# Patient Record
Sex: Female | Born: 1955 | Hispanic: Yes | Smoking: Never smoker
Health system: Southern US, Community
[De-identification: ages and names within clinical notes are randomized; demographics above are authoritative.]

## PROBLEM LIST (undated history)

## (undated) DIAGNOSIS — G43909 Migraine, unspecified, not intractable, without status migrainosus: Secondary | ICD-10-CM

## (undated) DIAGNOSIS — E559 Vitamin D deficiency, unspecified: Secondary | ICD-10-CM

## (undated) DIAGNOSIS — M1990 Unspecified osteoarthritis, unspecified site: Secondary | ICD-10-CM

## (undated) DIAGNOSIS — I10 Essential (primary) hypertension: Secondary | ICD-10-CM

## (undated) HISTORY — PX: CARPAL TUNNEL RELEASE: SHX101

## (undated) HISTORY — PX: ROTATOR CUFF REPAIR: SHX139

---

## 2004-05-17 ENCOUNTER — Ambulatory Visit (HOSPITAL_BASED_OUTPATIENT_CLINIC_OR_DEPARTMENT_OTHER): Admission: RE | Admit: 2004-05-17 | Discharge: 2004-05-17 | Payer: Self-pay | Admitting: Orthopedic Surgery

## 2005-02-08 ENCOUNTER — Ambulatory Visit: Payer: Self-pay

## 2005-07-05 ENCOUNTER — Other Ambulatory Visit: Payer: Self-pay

## 2005-07-05 ENCOUNTER — Emergency Department: Payer: Self-pay | Admitting: Emergency Medicine

## 2007-08-02 ENCOUNTER — Emergency Department: Payer: Self-pay | Admitting: Emergency Medicine

## 2008-08-25 ENCOUNTER — Ambulatory Visit: Payer: Self-pay

## 2008-10-02 ENCOUNTER — Emergency Department: Payer: Self-pay | Admitting: Emergency Medicine

## 2009-11-27 ENCOUNTER — Observation Stay: Payer: Self-pay | Admitting: Specialist

## 2009-12-06 ENCOUNTER — Ambulatory Visit: Payer: Self-pay | Admitting: Family Medicine

## 2011-02-02 ENCOUNTER — Ambulatory Visit: Payer: Self-pay | Admitting: Family Medicine

## 2011-05-29 ENCOUNTER — Ambulatory Visit: Payer: Self-pay | Admitting: Family Medicine

## 2012-11-20 ENCOUNTER — Ambulatory Visit: Payer: Self-pay | Admitting: Family Medicine

## 2014-05-06 ENCOUNTER — Ambulatory Visit: Payer: Self-pay

## 2014-05-14 ENCOUNTER — Ambulatory Visit: Payer: Self-pay

## 2015-07-01 ENCOUNTER — Other Ambulatory Visit: Payer: Self-pay | Admitting: Preventative Medicine

## 2015-07-01 DIAGNOSIS — Z1239 Encounter for other screening for malignant neoplasm of breast: Secondary | ICD-10-CM

## 2015-07-15 ENCOUNTER — Ambulatory Visit: Payer: Self-pay

## 2015-07-21 ENCOUNTER — Ambulatory Visit
Admission: RE | Admit: 2015-07-21 | Discharge: 2015-07-21 | Disposition: A | Discharge status: Home / Self Care | Payer: Medicaid Other | Source: Ambulatory Visit | Attending: Preventative Medicine | Admitting: Preventative Medicine

## 2015-07-21 DIAGNOSIS — Z1231 Encounter for screening mammogram for malignant neoplasm of breast: Secondary | ICD-10-CM | POA: Diagnosis present

## 2015-07-21 DIAGNOSIS — Z1239 Encounter for other screening for malignant neoplasm of breast: Secondary | ICD-10-CM

## 2016-01-03 ENCOUNTER — Emergency Department: Payer: Medicaid Other

## 2016-01-03 ENCOUNTER — Emergency Department
Admission: EM | Admit: 2016-01-03 | Discharge: 2016-01-03 | Disposition: A | Discharge status: Home / Self Care | Payer: Medicaid Other | Attending: Emergency Medicine | Admitting: Emergency Medicine

## 2016-01-03 ENCOUNTER — Encounter: Payer: Self-pay | Admitting: *Deleted

## 2016-01-03 DIAGNOSIS — R05 Cough: Secondary | ICD-10-CM | POA: Diagnosis present

## 2016-01-03 DIAGNOSIS — J40 Bronchitis, not specified as acute or chronic: Secondary | ICD-10-CM

## 2016-01-03 DIAGNOSIS — I10 Essential (primary) hypertension: Secondary | ICD-10-CM | POA: Diagnosis not present

## 2016-01-03 DIAGNOSIS — R059 Cough, unspecified: Secondary | ICD-10-CM

## 2016-01-03 HISTORY — DX: Essential (primary) hypertension: I10

## 2016-01-03 LAB — BASIC METABOLIC PANEL
Anion gap: 7 (ref 5–15)
BUN: 12 mg/dL (ref 6–20)
CALCIUM: 8.7 mg/dL — AB (ref 8.9–10.3)
CO2: 25 mmol/L (ref 22–32)
CREATININE: 0.73 mg/dL (ref 0.44–1.00)
Chloride: 103 mmol/L (ref 101–111)
GFR calc non Af Amer: >60 mL/min (ref >60)
GLUCOSE: 112 mg/dL — AB (ref 65–99)
Potassium: 3.6 mmol/L (ref 3.5–5.1)
Sodium: 135 mmol/L (ref 135–145)

## 2016-01-03 LAB — CBC
HCT: 42.1 % (ref 35.0–47.0)
Hemoglobin: 14.2 g/dL (ref 12.0–16.0)
MCH: 29.4 pg (ref 26.0–34.0)
MCHC: 33.8 g/dL (ref 32.0–36.0)
MCV: 87 fL (ref 80.0–100.0)
PLATELETS: 268 10*3/uL (ref 150–440)
RBC: 4.84 MIL/uL (ref 3.80–5.20)
RDW: 14.2 % (ref 11.5–14.5)
WBC: 9.7 10*3/uL (ref 3.6–11.0)

## 2016-01-03 LAB — TROPONIN I

## 2016-01-03 MED ORDER — AZITHROMYCIN 250 MG PO TABS: ORAL_TABLET | ORAL | 0 refills | Status: AC

## 2016-01-03 MED ORDER — DIAZEPAM 5 MG/ML IJ SOLN: 2.0000 mg | Freq: Once | INTRAMUSCULAR | Status: DC

## 2016-01-03 MED ORDER — AZITHROMYCIN 500 MG PO TABS
500.0000 mg | ORAL_TABLET | Freq: Once | ORAL | Status: AC
  Administered 2016-01-03: 500 mg via ORAL
  Filled 2016-01-03: qty 1

## 2016-01-03 MED ORDER — PREDNISONE 20 MG PO TABS: 60.0000 mg | ORAL_TABLET | Freq: Every day | ORAL | 0 refills | Status: AC

## 2016-01-03 MED ORDER — IPRATROPIUM-ALBUTEROL 0.5-2.5 (3) MG/3ML IN SOLN
9.0000 mL | Freq: Once | RESPIRATORY_TRACT | Status: AC
  Administered 2016-01-03: 9 mL via RESPIRATORY_TRACT
  Filled 2016-01-03: qty 9

## 2016-01-03 MED ORDER — PREDNISONE 20 MG PO TABS
60.0000 mg | ORAL_TABLET | Freq: Once | ORAL | Status: AC
  Administered 2016-01-03: 60 mg via ORAL
  Filled 2016-01-03: qty 3

## 2016-01-03 MED ORDER — ALBUTEROL SULFATE HFA 108 (90 BASE) MCG/ACT IN AERS: 2.0000 | INHALATION_SPRAY | Freq: Four times a day (QID) | RESPIRATORY_TRACT | 0 refills | Status: AC | PRN

## 2016-01-03 NOTE — ED Notes (Signed)
Pt. Reporting minimal relief following completion of neb tx. Attempting productive cough, but soreness in chest prevents.

## 2016-01-03 NOTE — ED Notes (Signed)
Pt. Verbalizes understanding of d/c instructions, prescriptions, and follow-up per Interpreter. VS stable and pain controlled per pt.  Pt. In NAD at time of d/c and denies further concerns regarding this visit. Pt. Stable at the time of departure from the unit, departing unit by the safest and most appropriate manner per that pt condition and limitations. Pt advised to return to the ED at any time for emergent concerns, or for new/worsening symptoms.

## 2016-01-03 NOTE — ED Triage Notes (Addendum)
States chest discomfort since Tuesday, states she saw her PCP on Thursday and was given protonix for indigestion, pt states continued discomfort, states fatigue with walking and increased pain

## 2016-01-03 NOTE — ED Notes (Signed)
Pt. Reports discomfort with deep breathing in the chest.

## 2016-01-03 NOTE — ED Provider Notes (Signed)
[facility] Emergency Department Provider Note   ____________________________________________   First MD Initiated Contact with Patient 01/03/16 1826     (approximate)  I have reviewed the triage vital signs and the nursing notes.  Patient is Spanish-speaking only. Interpreter, Duwayne Hecksaiah, present for the interaction. HISTORY  Chief Complaint Chest Pain   HPI Dorothy Patton is a 60 y.o. female with a history of hypertension who is presenting to the emergency department today with 1 week of worsening chest pain and shortness of breath. She says that her chest feels tight and that she has difficulty breathing. She says especially at night she feels like she is having snoring respirations. Over the past 2 days she has began to have a worsening cough. She says that this past Thursday she saw her primary care doctor who thought that she had inflammation or lungs and also gave her a medicine for reflux. However, her condition has continued to worsen. She denies any runny nose but says that she has been coughing up intermittent green mucus. Denies any known sick contacts. Does not report any fevers. Denies any overt pain of the chest describes more as tightness.   Past Medical History:  Diagnosis Date  . Hypertension     There are no active problems to display for this patient.   History reviewed. No pertinent surgical history.  Prior to Admission medications   Not on File    Allergies Review of patient's allergies indicates no known allergies.  History reviewed. No pertinent family history.  Social History Social History  Substance Use Topics  . Smoking status: Never Smoker  . Smokeless tobacco: Never Used  . Alcohol use Not on file    Review of Systems Constitutional: No fever/chills Eyes: No visual changes. ENT: No sore throat. Cardiovascular: As above Respiratory: As above Gastrointestinal: No abdominal pain.  No nausea, no vomiting.  No  diarrhea.  No constipation. Genitourinary: Negative for dysuria. Musculoskeletal: Negative for back pain. Skin: Negative for rash. Neurological: Negative for headaches, focal weakness or numbness.  10-point ROS otherwise negative.  ____________________________________________   PHYSICAL EXAM:  VITAL SIGNS: ED Triage Vitals  Enc Vitals Group     BP 01/03/16 1714 (!) 152/82     Pulse Rate 01/03/16 1714 96     Resp 01/03/16 1714 18     Temp 01/03/16 1714 98 F (36.7 C)     Temp Source 01/03/16 1714 Oral     SpO2 01/03/16 1714 100 %     Weight 01/03/16 1714 242 lb (109.8 kg)     Height 01/03/16 1714 5\' 2"  (1.575 m)     Head Circumference --      Peak Flow --      Pain Score 01/03/16 1715 8     Pain Loc --      Pain Edu? --      Excl. in GC? --     Constitutional: Alert and oriented. Well appearing and in no acute distress. Eyes: Conjunctivae are normal. PERRL. EOMI. Head: Atraumatic. Nose: Mild rhinorrhea bilaterally Mouth/Throat: Mucous membranes are moist.  Oropharynx non-erythematous. Neck: No stridor.   Cardiovascular: Normal rate, regular rhythm. Grossly normal heart sounds.  Tenderness over the sternum to palpation. Respiratory: Normal respiratory effort.  No retractions. Mild and scant wheezing scattered throughout all fields. No respiratory distress. Speaks in full sentences. Gastrointestinal: Soft and nontender. No distention.  Musculoskeletal: No lower extremity tenderness nor edema.  No joint effusions. Neurologic:  Normal speech and  language. No gross focal neurologic deficits are appreciated. Skin:  Skin is warm, dry and intact. No rash noted. Psychiatric: Mood and affect are normal. Speech and behavior are normal.  ____________________________________________   LABS (all labs ordered are listed, but only abnormal results are displayed)  Labs Reviewed  BASIC METABOLIC PANEL - Abnormal; Notable for the following:       Result Value   Glucose, Bld 112 (*)     Calcium 8.7 (*)    All other components within normal limits  CBC  TROPONIN I   ____________________________________________  EKG  ED ECG REPORT I, Arelia Longest, the attending physician, personally viewed and interpreted this ECG.   Date: 01/03/2016  EKG Time: 1715  Rate: 92  Rhythm: normal sinus rhythm  Axis: Normal  Intervals:none  ST&T Change: No ST segment elevation or depression. No abnormal T-wave inversion.  ____________________________________________  RADIOLOGY  DG Chest 2 View (Accession [id number]) (Order [id number])  Imaging  Date: 01/03/2016 Department: [facility] EMERGENCY DEPARTMENT Released By: Sharyn Blitz, RN (auto-released) Authorizing: Myrna Blazer, MD  PACS Images   Show images for DG Chest 2 View  Study Result   CLINICAL DATA:  60 year old female with chest  EXAM: CHEST  2 VIEW  COMPARISON:  Chest radiograph dated 11/27/2009  FINDINGS: The heart size and mediastinal contours are within normal limits. Both lungs are clear. The visualized skeletal structures are unremarkable.  IMPRESSION: No active cardiopulmonary disease.   Electronically Signed   By: Elgie Collard M.D.   On: 01/03/2016 18:22     ____________________________________________   PROCEDURES  Procedure(s) performed:   Procedures  Critical Care performed:   ____________________________________________   INITIAL IMPRESSION / ASSESSMENT AND PLAN / ED COURSE  Pertinent labs & imaging results that were available during my care of the patient were reviewed by me and considered in my medical decision making (see chart for details).  Patient's presentation is consistent with bronchitis. We'll treat with DuoNeb as well as steroids in the emergency department and reassess.  Clinical Course   ----------------------------------------- 8:31 PM on 01/03/2016 -----------------------------------------  Patient with  very reassuring imaging as well as lab work thus far. However, still with pain. We'll give Toradol as well as Valium and reassess. Patient at this time, despite still complaining of pain does not appear in any acute distress. Vital signs remain normal.   ----------------------------------------- 8:43 PM on 01/03/2016 -----------------------------------------  Patient says that she does not feel any chest pressure and is breathing easier after name Theora Gianotti treatments and steroids. However, she feels still the irritation in her throat. She is having intermittent coughing still. However, I auscultate her throat and she is not having any stridor. There is no rest or distress. She likely has a bronchitis and possibly postnasal drip. I'll be discharging her on azithromycin as well as prednisone with an inhaler. She'll be following up with her primary care doctor. We discussed return precautions including any worsening and concerning symptoms especially if she has difficulty breathing or feels her throat closing. She is understanding of this plan and willing to comply. The interpreter, Duwayne Heck, was there as well for this follow-up examination. ____________________________________________   FINAL CLINICAL IMPRESSION(S) / ED DIAGNOSES  Bronchitis. Cough.    NEW MEDICATIONS STARTED DURING THIS VISIT:  New Prescriptions   No medications on file     Note:  This document was prepared using Dragon voice recognition software and may include unintentional dictation errors.    Onalee Hua  Ardath Sax, MD 01/03/16 2045

## 2016-01-03 NOTE — ED Notes (Signed)
Pt. Displaying equal, even, unlabored breaths with reduced coughing from previous round.

## 2016-01-03 NOTE — ED Notes (Signed)
Pt. Up to restroom at this time.

## 2016-03-16 ENCOUNTER — Emergency Department: Payer: Medicaid Other

## 2016-03-16 ENCOUNTER — Encounter: Payer: Self-pay | Admitting: Emergency Medicine

## 2016-03-16 ENCOUNTER — Emergency Department
Admission: EM | Admit: 2016-03-16 | Discharge: 2016-03-16 | Disposition: A | Discharge status: Home / Self Care | Payer: Medicaid Other | Attending: Emergency Medicine | Admitting: Emergency Medicine

## 2016-03-16 DIAGNOSIS — R42 Dizziness and giddiness: Secondary | ICD-10-CM

## 2016-03-16 DIAGNOSIS — I10 Essential (primary) hypertension: Secondary | ICD-10-CM | POA: Diagnosis not present

## 2016-03-16 HISTORY — DX: Vitamin D deficiency, unspecified: E55.9

## 2016-03-16 HISTORY — DX: Unspecified osteoarthritis, unspecified site: M19.90

## 2016-03-16 HISTORY — DX: Migraine, unspecified, not intractable, without status migrainosus: G43.909

## 2016-03-16 LAB — TROPONIN I
Troponin I: 0.03 ng/mL (ref <0.03)
Troponin I: <0.03 ng/mL (ref <0.03)

## 2016-03-16 LAB — COMPREHENSIVE METABOLIC PANEL
ALBUMIN: 3.9 g/dL (ref 3.5–5.0)
ALT: 18 U/L (ref 14–54)
ANION GAP: 9 (ref 5–15)
AST: 18 U/L (ref 15–41)
Alkaline Phosphatase: 58 U/L (ref 38–126)
BILIRUBIN TOTAL: 0.8 mg/dL (ref 0.3–1.2)
BUN: 23 mg/dL — AB (ref 6–20)
CO2: 20 mmol/L — AB (ref 22–32)
Calcium: 9.5 mg/dL (ref 8.9–10.3)
Chloride: 105 mmol/L (ref 101–111)
Creatinine, Ser: 0.61 mg/dL (ref 0.44–1.00)
GFR calc Af Amer: >60 mL/min (ref >60)
GFR calc non Af Amer: >60 mL/min (ref >60)
GLUCOSE: 154 mg/dL — AB (ref 65–99)
POTASSIUM: 4 mmol/L (ref 3.5–5.1)
SODIUM: 134 mmol/L — AB (ref 135–145)
Total Protein: 7.9 g/dL (ref 6.5–8.1)

## 2016-03-16 LAB — CBC
HEMATOCRIT: 45.3 % (ref 35.0–47.0)
HEMOGLOBIN: 14.2 g/dL (ref 12.0–16.0)
MCH: 27.5 pg (ref 26.0–34.0)
MCHC: 31.4 g/dL — AB (ref 32.0–36.0)
MCV: 87.4 fL (ref 80.0–100.0)
Platelets: 331 10*3/uL (ref 150–440)
RBC: 5.18 MIL/uL (ref 3.80–5.20)
RDW: 14.7 % — AB (ref 11.5–14.5)
WBC: 13.3 10*3/uL — AB (ref 3.6–11.0)

## 2016-03-16 MED ORDER — MECLIZINE HCL 25 MG PO TABS: 25.0000 mg | ORAL_TABLET | Freq: Three times a day (TID) | ORAL | 0 refills | Status: DC | PRN

## 2016-03-16 MED ORDER — MECLIZINE HCL 25 MG PO TABS
25.0000 mg | ORAL_TABLET | Freq: Once | ORAL | Status: AC
  Administered 2016-03-16: 25 mg via ORAL
  Filled 2016-03-16: qty 1

## 2016-03-16 MED ORDER — LORAZEPAM 2 MG/ML IJ SOLN
0.5000 mg | Freq: Once | INTRAMUSCULAR | Status: AC
  Administered 2016-03-16: 0.5 mg via INTRAVENOUS
  Filled 2016-03-16: qty 1

## 2016-03-16 MED ORDER — SODIUM CHLORIDE 0.9 % IV SOLN: 1000.0000 mL | Freq: Once | INTRAVENOUS | Status: DC

## 2016-03-16 MED ORDER — SODIUM CHLORIDE 0.9 % IV SOLN
1000.0000 mL | Freq: Once | INTRAVENOUS | Status: AC
  Administered 2016-03-16: 1000 mL via INTRAVENOUS

## 2016-03-16 NOTE — ED Notes (Signed)
Pt to MRI via stretcher with tech

## 2016-03-16 NOTE — ED Notes (Signed)
Dr. Cyril Loosenkinner notified of trop. 0.03

## 2016-03-16 NOTE — Discharge Instructions (Signed)
Stay hydrated.   Take meclizine as needed for dizziness.   See your doctor  Return to ER if you have worse dizziness, vertigo, passing out, chest pain.

## 2016-03-16 NOTE — ED Triage Notes (Signed)
Per interpreter pt with dizziness that started this am around 0620. Pt denies any pain at this time. Pt states she suffers from migraines and has hot flashes.

## 2016-03-16 NOTE — ED Notes (Signed)
All assessment done with interpreter at bedside

## 2016-03-16 NOTE — ED Provider Notes (Signed)
  Physical Exam  BP 129/77   Pulse 89   Temp 98.9 F (37.2 C) (Oral)   Resp 15   Ht 5\' 2"  (1.575 m)   Wt 242 lb (109.8 kg)   SpO2 100%   BMI 44.26 kg/m   Physical Exam  ED Course  Procedures  MDM Care assumed at 4 pm from Dr. Cyril LoosenKinner. Patient here with dizziness. Initial Trop 0.03 but repeat neg. Bicarb 20. Appears dehydrated. Given meclizine, ativan, IVF but still dizzy. On initial exam, has trouble opening eyes. Seems to have nystagmus changing with directions. No other stroke symptoms.  Patient not orthostatic. Sign out pending NS 1 L bolus and reassessment. After 1 L NS bolus, felt better. Has some nystagmus but able to walk. MRI showed no stroke. Will dc home with meclizine for likely peripheral vertigo.     Charlynne Panderavid Hsienta Yao, MD 03/16/16 [phone]

## 2016-03-16 NOTE — ED Notes (Signed)
Patient transported to MRI 

## 2016-03-25 NOTE — ED Provider Notes (Signed)
[facility] Emergency Department Provider Note   ____________________________________________    I have reviewed the triage vital signs and the nursing notes.   HISTORY  Chief Complaint Dizziness     HPI Dorothy Patton is a 60 y.o. female who presents with complaints of dizziness. Patient reports she has a sensation of the room spinning but also feels lightheaded at times. She denies fevers or chills. She denies neuro deficits. She denies headache. Mild nausea but no vomiting. She has never had this before.   Past Medical History:  Diagnosis Date  . Arthritis   . Hypertension   . Migraines   . Vitamin D deficiency     There are no active problems to display for this patient.   Past Surgical History:  Procedure Laterality Date  . CARPAL TUNNEL RELEASE    . ROTATOR CUFF REPAIR      Prior to Admission medications   Medication Sig Start Date End Date Taking? Authorizing Provider  albuterol (PROVENTIL HFA;VENTOLIN HFA) 108 (90 Base) MCG/ACT inhaler Inhale 2 puffs into the lungs every 6 (six) hours as needed for wheezing or shortness of breath. 01/03/16   Myrna Blazeravid Matthew Schaevitz, MD  meclizine (ANTIVERT) 25 MG tablet Take 1 tablet (25 mg total) by mouth 3 (three) times daily as needed for dizziness. 03/16/16   Charlynne Panderavid Hsienta Yao, MD  predniSONE (DELTASONE) 20 MG tablet Take 3 tablets (60 mg total) by mouth daily. 01/04/16 01/03/17  Myrna Blazeravid Matthew Schaevitz, MD     Allergies Patient has no known allergies.  No family history on file.  Social History Social History  Substance Use Topics  . Smoking status: Never Smoker  . Smokeless tobacco: Never Used  . Alcohol use No    Review of Systems  Constitutional: No fever/chills Eyes: No visual changes.   Cardiovascular: Denies chest pain. Respiratory: Denies shortness of breath. Gastrointestinal: No abdominal pain.  Genitourinary: Negative for dysuria. Musculoskeletal: Negative for back  pain. Skin: Negative for rash. Neurological: Negative for Focal weakness  10-point ROS otherwise negative.  ____________________________________________   PHYSICAL EXAM:  VITAL SIGNS: ED Triage Vitals   Enc Vitals Group     BP (!) 155/94     Pulse Rate (!) 109     Resp 20     Temp 98.9 F (37.2 C)     Temp Source Oral     SpO2 97 %     Weight 242 lb (109.8 kg)     Height 5\' 2"  (1.575 m)     Head Circumference      Peak Flow      Pain Score      Pain Loc      Pain Edu?      Excl. in GC?     Constitutional: Alert and oriented. No acute distress. Pleasant and interactive Eyes: Conjunctivae are normal. nystagmus, horizontal, tympanic membranes normal bilaterally Head: Atraumatic. Nose: No congestion/rhinnorhea. Mouth/Throat: Mucous membranes are moist.   Neck:  Painless ROM Cardiovascular: Normal rate, regular rhythm. Grossly normal heart sounds.  Good peripheral circulation. Respiratory: Normal respiratory effort.  No retractions. Lungs CTAB. Gastrointestinal: Soft and nontender. No distention.  No CVA tenderness. Genitourinary: deferred Musculoskeletal: No lower extremity tenderness nor edema.  Warm and well perfused Neurologic:  Normal speech and language. No gross focal neurologic deficits are appreciated. Cranial nerves II-12 are normal Skin:  Skin is warm, dry and intact. No rash noted. Psychiatric: Mood and affect are normal. Speech and behavior are  normal.  ____________________________________________   LABS (all labs ordered are listed, but only abnormal results are displayed)  Labs Reviewed  CBC - Abnormal; Notable for the following:       Result Value   WBC 13.3 (*)    MCHC 31.4 (*)    RDW 14.7 (*)    All other components within normal limits  COMPREHENSIVE METABOLIC PANEL - Abnormal; Notable for the following:    Sodium 134 (*)    CO2 20 (*)    Glucose, Bld 154 (*)    BUN 23 (*)    All other components within normal limits  TROPONIN I -  Abnormal; Notable for the following:    Troponin I 0.03 (*)    All other components within normal limits  TROPONIN I   ____________________________________________  EKG  ED ECG REPORT I, Jene EveryKINNER, Sandralee Tarkington, the attending physician, personally viewed and interpreted this ECG.   Rate: 104 Rhythm: Sinus tachycardia QRS Axis: normal Intervals: normal ST/T Wave abnormalities: Nonspecific Conduction Disturbances: none Narrative Interpretation: unremarkable  ____________________________________________  RADIOLOGY  CT head unremarkable ____________________________________________   PROCEDURES  Procedure(s) performed: No    Critical Care performed: No ____________________________________________   INITIAL IMPRESSION / ASSESSMENT AND PLAN / ED COURSE  Pertinent labs & imaging results that were available during my care of the patient were reviewed by me and considered in my medical decision making (see chart for details).  Patient presents with dizziness which seems to be most consistent with vertigo, we will treat with meclizine and Ativan and IV fluids and reevaluate.  Clinical Course   Asked Dr. Silverio Layyao to follow up on patient after repeat fluids ____________________________________________   FINAL CLINICAL IMPRESSION(S) / ED DIAGNOSES  Final diagnoses:  Vertigo      NEW MEDICATIONS STARTED DURING THIS VISIT:  Discharge Medication List as of 03/16/2016  5:51 PM    START taking these medications   Details  meclizine (ANTIVERT) 25 MG tablet Take 1 tablet (25 mg total) by mouth 3 (three) times daily as needed for dizziness., Starting Thu 03/16/2016, Print         Note:  This document was prepared using Dragon voice recognition software and may include unintentional dictation errors.    Jene Everyobert Claretta Kendra, MD 03/25/16 [phone]

## 2016-11-09 ENCOUNTER — Other Ambulatory Visit: Payer: Self-pay | Admitting: Family Medicine

## 2016-11-09 DIAGNOSIS — Z1231 Encounter for screening mammogram for malignant neoplasm of breast: Secondary | ICD-10-CM

## 2016-12-18 ENCOUNTER — Ambulatory Visit
Admission: RE | Admit: 2016-12-18 | Discharge: 2016-12-18 | Disposition: A | Discharge status: Home / Self Care | Payer: Medicare Other | Source: Ambulatory Visit | Attending: Family Medicine | Admitting: Family Medicine

## 2016-12-18 DIAGNOSIS — Z1231 Encounter for screening mammogram for malignant neoplasm of breast: Secondary | ICD-10-CM | POA: Diagnosis not present

## 2017-11-16 IMAGING — MG MM DIGITAL SCREENING BILAT W/ CAD
1 series · 5 of 5 positions shown · non-contrast
Comparison: Previous exam(s).

CLINICAL DATA: Screening.

EXAM:
DIGITAL SCREENING BILATERAL MAMMOGRAM WITH CAD

[R CC · right · 5 of 5 slices shown]
[im 1/5]
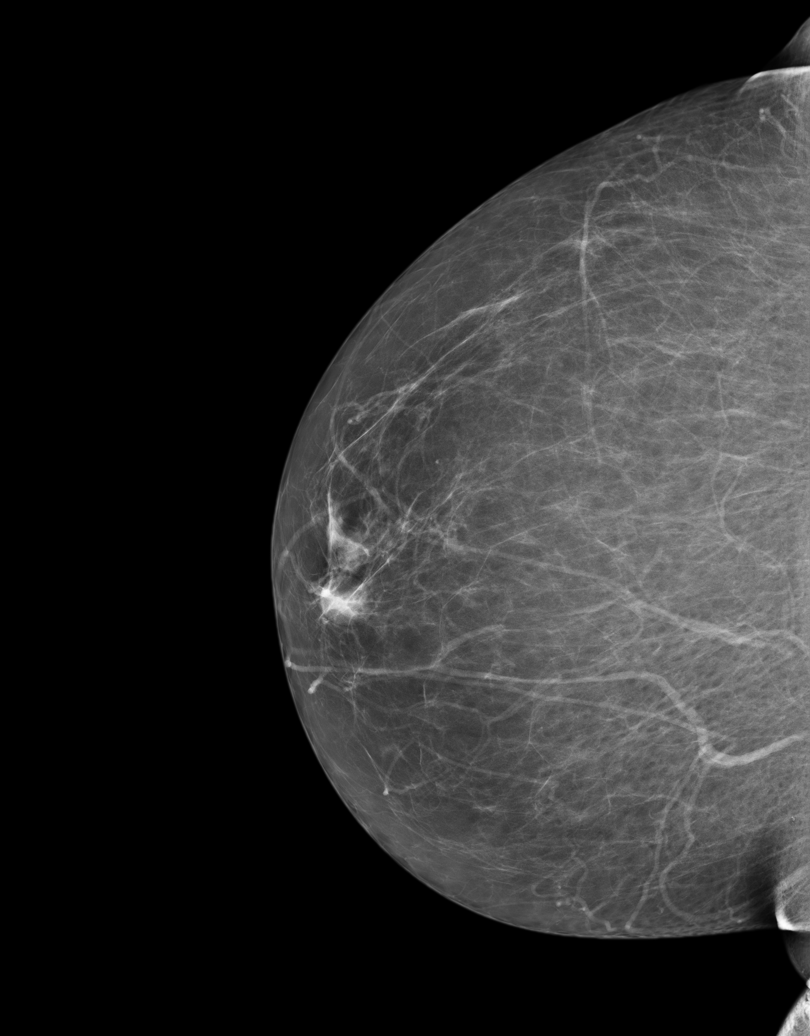
[im 2/5]
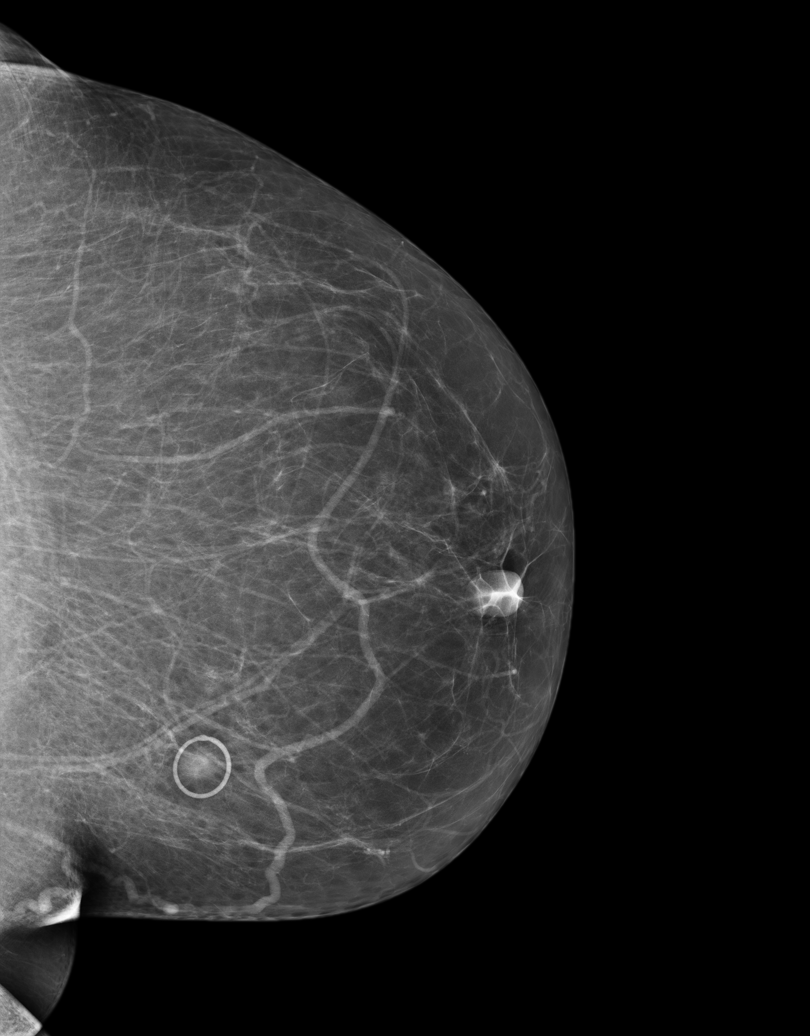
[im 3/5]
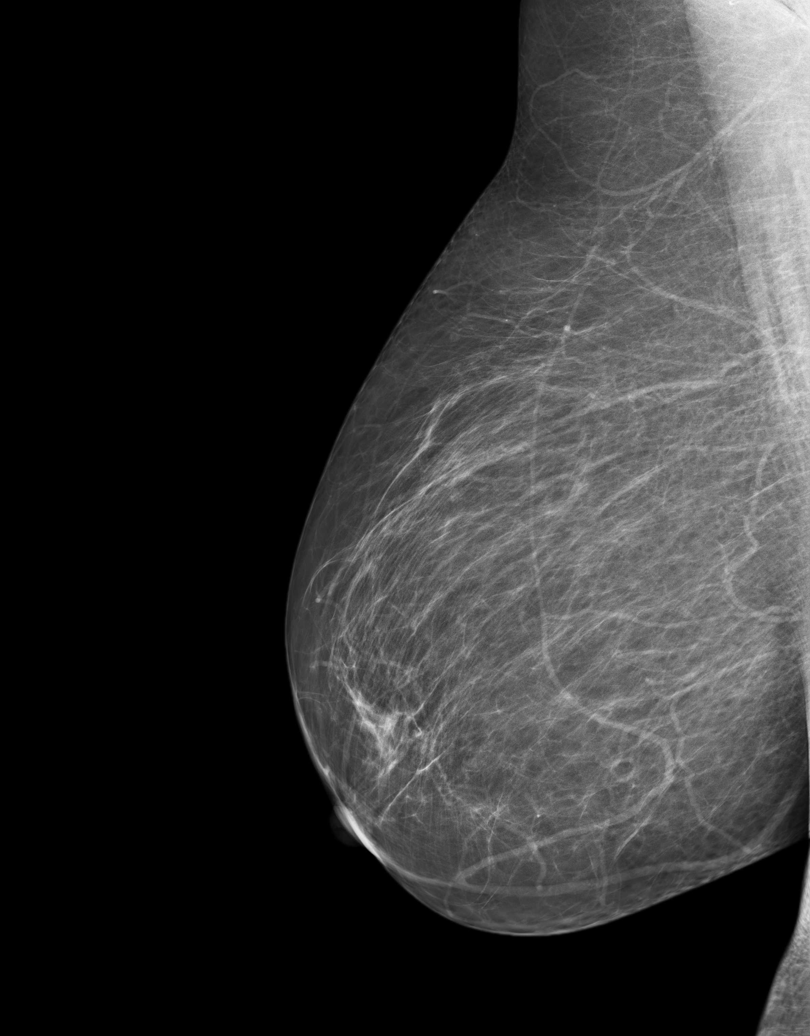
[im 4/5]
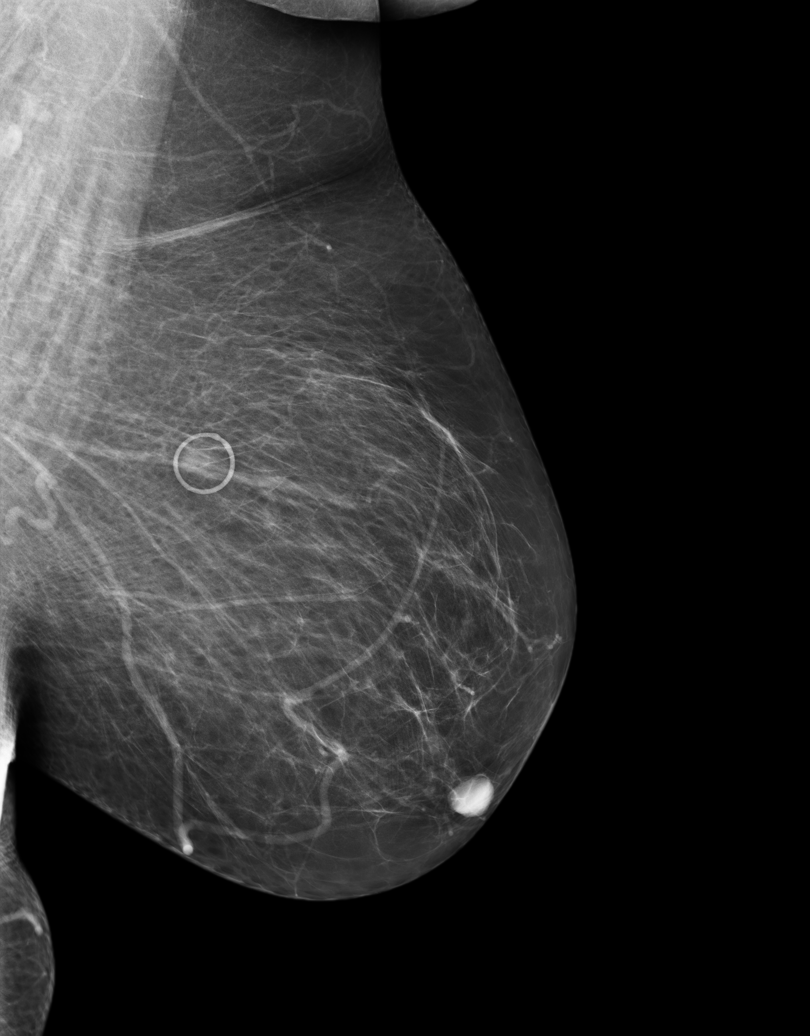
[im 5/5]
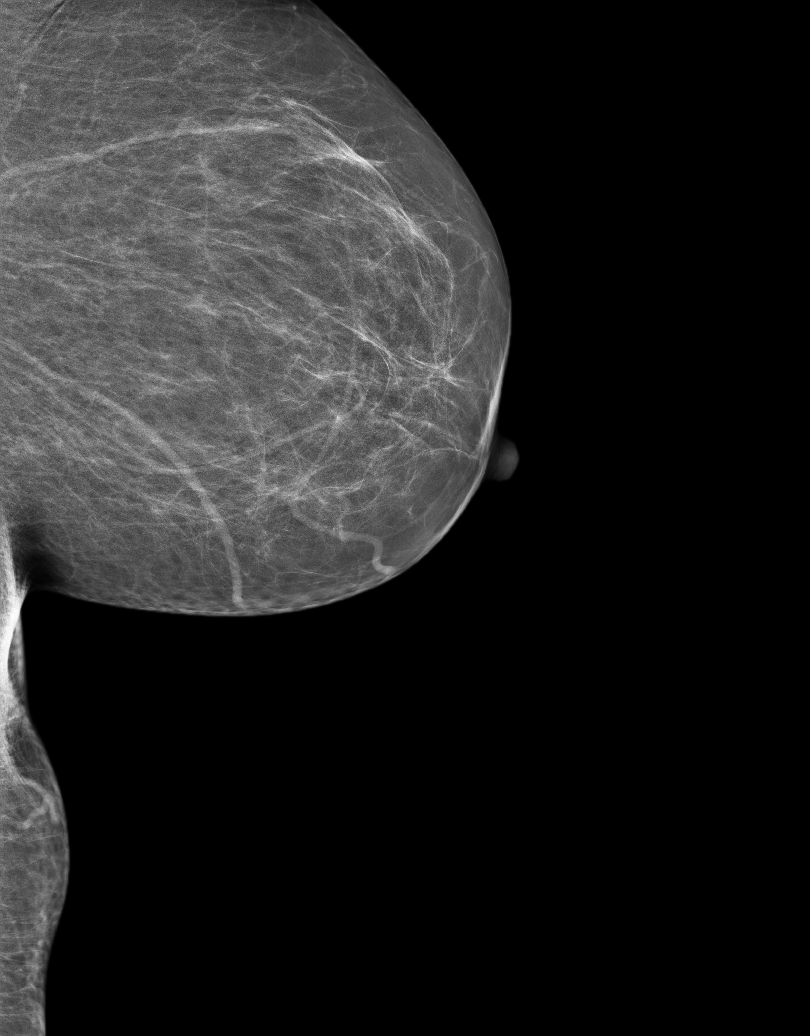

[5 of 5 positions shown; findings below may reference images not displayed]

ACR Breast Density Category b: There are scattered areas of
fibroglandular density.
FINDINGS: There are no findings suspicious for malignancy. Images were
processed with CAD.
IMPRESSION: No mammographic evidence of malignancy. A result letter of this
screening mammogram will be mailed directly to the patient.

RECOMMENDATION:
Screening mammogram in one year. (Code:AS-G-LCT)

BI-RADS CATEGORY  1: Negative.

## 2017-11-29 ENCOUNTER — Other Ambulatory Visit: Payer: Self-pay | Admitting: Family Medicine

## 2017-11-29 DIAGNOSIS — Z1231 Encounter for screening mammogram for malignant neoplasm of breast: Secondary | ICD-10-CM

## 2017-12-19 ENCOUNTER — Ambulatory Visit
Admission: RE | Admit: 2017-12-19 | Discharge: 2017-12-19 | Disposition: A | Discharge status: Home / Self Care | Payer: Medicare Other | Source: Ambulatory Visit | Attending: Family Medicine | Admitting: Family Medicine

## 2017-12-19 DIAGNOSIS — Z1231 Encounter for screening mammogram for malignant neoplasm of breast: Secondary | ICD-10-CM | POA: Diagnosis not present

## 2020-01-03 ENCOUNTER — Emergency Department: Payer: Medicare Other

## 2020-01-03 ENCOUNTER — Encounter: Payer: Self-pay | Admitting: Radiology

## 2020-01-03 ENCOUNTER — Other Ambulatory Visit: Payer: Self-pay

## 2020-01-03 DIAGNOSIS — I10 Essential (primary) hypertension: Secondary | ICD-10-CM | POA: Diagnosis present

## 2020-01-03 DIAGNOSIS — E871 Hypo-osmolality and hyponatremia: Secondary | ICD-10-CM | POA: Diagnosis present

## 2020-01-03 DIAGNOSIS — U071 COVID-19: Secondary | ICD-10-CM | POA: Diagnosis not present

## 2020-01-03 DIAGNOSIS — Z6841 Body Mass Index (BMI) 40.0 and over, adult: Secondary | ICD-10-CM

## 2020-01-03 DIAGNOSIS — E876 Hypokalemia: Secondary | ICD-10-CM | POA: Diagnosis present

## 2020-01-03 DIAGNOSIS — G43909 Migraine, unspecified, not intractable, without status migrainosus: Secondary | ICD-10-CM | POA: Diagnosis present

## 2020-01-03 DIAGNOSIS — R0602 Shortness of breath: Secondary | ICD-10-CM | POA: Diagnosis not present

## 2020-01-03 DIAGNOSIS — J1282 Pneumonia due to coronavirus disease 2019: Secondary | ICD-10-CM | POA: Diagnosis present

## 2020-01-03 DIAGNOSIS — R0902 Hypoxemia: Secondary | ICD-10-CM | POA: Diagnosis present

## 2020-01-03 DIAGNOSIS — M1990 Unspecified osteoarthritis, unspecified site: Secondary | ICD-10-CM | POA: Diagnosis present

## 2020-01-03 LAB — CBC WITH DIFFERENTIAL/PLATELET
Abs Immature Granulocytes: 0.05 10*3/uL (ref 0.00–0.07)
Basophils Absolute: 0 10*3/uL (ref 0.0–0.1)
Basophils Relative: 0 %
Eosinophils Absolute: 0 10*3/uL (ref 0.0–0.5)
Eosinophils Relative: 0 %
HCT: 37 % (ref 36.0–46.0)
Hemoglobin: 12.3 g/dL (ref 12.0–15.0)
Immature Granulocytes: 1 %
Lymphocytes Relative: 6 %
Lymphs Abs: 0.5 10*3/uL — ABNORMAL LOW (ref 0.7–4.0)
MCH: 26.5 pg (ref 26.0–34.0)
MCHC: 33.2 g/dL (ref 30.0–36.0)
MCV: 79.7 fL — ABNORMAL LOW (ref 80.0–100.0)
Monocytes Absolute: 0.4 10*3/uL (ref 0.1–1.0)
Monocytes Relative: 5 %
Neutro Abs: 7.8 10*3/uL — ABNORMAL HIGH (ref 1.7–7.7)
Neutrophils Relative %: 88 %
Platelets: 234 10*3/uL (ref 150–400)
RBC: 4.64 MIL/uL (ref 3.87–5.11)
RDW: 15.4 % (ref 11.5–15.5)
WBC: 8.7 10*3/uL (ref 4.0–10.5)
nRBC: 0 % (ref 0.0–0.2)

## 2020-01-03 LAB — COMPREHENSIVE METABOLIC PANEL
ALT: 23 U/L (ref 0–44)
AST: 35 U/L (ref 15–41)
Albumin: 3.1 g/dL — ABNORMAL LOW (ref 3.5–5.0)
Alkaline Phosphatase: 63 U/L (ref 38–126)
Anion gap: 12 (ref 5–15)
BUN: 10 mg/dL (ref 8–23)
CO2: 22 mmol/L (ref 22–32)
Calcium: 8.5 mg/dL — ABNORMAL LOW (ref 8.9–10.3)
Chloride: 93 mmol/L — ABNORMAL LOW (ref 98–111)
Creatinine, Ser: 0.63 mg/dL (ref 0.44–1.00)
GFR calc Af Amer: >60 mL/min (ref >60)
GFR calc non Af Amer: >60 mL/min (ref >60)
Glucose, Bld: 139 mg/dL — ABNORMAL HIGH (ref 70–99)
Potassium: 3.4 mmol/L — ABNORMAL LOW (ref 3.5–5.1)
Sodium: 127 mmol/L — ABNORMAL LOW (ref 135–145)
Total Bilirubin: 0.6 mg/dL (ref 0.3–1.2)
Total Protein: 7.5 g/dL (ref 6.5–8.1)

## 2020-01-03 LAB — TROPONIN I (HIGH SENSITIVITY): Troponin I (High Sensitivity): 5 ng/L (ref <18)

## 2020-01-03 NOTE — ED Triage Notes (Addendum)
Info obtained via [facility] interpreter [place].  Pateint reports trouble breathing since yesterday.  Received +COVID test on Wednesday.

## 2020-01-04 ENCOUNTER — Inpatient Hospital Stay
Admission: EM | Admit: 2020-01-04 | Discharge: 2020-01-08 | DRG: 177 | Disposition: A | Discharge status: Home / Self Care | Payer: Medicare Other | Attending: Internal Medicine | Admitting: Internal Medicine

## 2020-01-04 DIAGNOSIS — R0902 Hypoxemia: Secondary | ICD-10-CM | POA: Diagnosis present

## 2020-01-04 DIAGNOSIS — G43909 Migraine, unspecified, not intractable, without status migrainosus: Secondary | ICD-10-CM | POA: Diagnosis present

## 2020-01-04 DIAGNOSIS — J1282 Pneumonia due to coronavirus disease 2019: Secondary | ICD-10-CM

## 2020-01-04 DIAGNOSIS — E871 Hypo-osmolality and hyponatremia: Secondary | ICD-10-CM | POA: Diagnosis present

## 2020-01-04 DIAGNOSIS — Z6841 Body Mass Index (BMI) 40.0 and over, adult: Secondary | ICD-10-CM | POA: Diagnosis not present

## 2020-01-04 DIAGNOSIS — R0602 Shortness of breath: Secondary | ICD-10-CM | POA: Diagnosis present

## 2020-01-04 DIAGNOSIS — I10 Essential (primary) hypertension: Secondary | ICD-10-CM | POA: Diagnosis present

## 2020-01-04 DIAGNOSIS — U071 COVID-19: Secondary | ICD-10-CM | POA: Diagnosis present

## 2020-01-04 DIAGNOSIS — M1990 Unspecified osteoarthritis, unspecified site: Secondary | ICD-10-CM | POA: Diagnosis present

## 2020-01-04 DIAGNOSIS — E876 Hypokalemia: Secondary | ICD-10-CM

## 2020-01-04 LAB — HIV ANTIBODY (ROUTINE TESTING W REFLEX): HIV Screen 4th Generation wRfx: NONREACTIVE

## 2020-01-04 LAB — FIBRIN DERIVATIVES D-DIMER (ARMC ONLY): Fibrin derivatives D-dimer (ARMC): 911.89 ng/mL (FEU) — ABNORMAL HIGH (ref 0.00–499.00)

## 2020-01-04 LAB — TROPONIN I (HIGH SENSITIVITY): Troponin I (High Sensitivity): 10 ng/L (ref <18)

## 2020-01-04 LAB — FERRITIN: Ferritin: 159 ng/mL (ref 11–307)

## 2020-01-04 LAB — C-REACTIVE PROTEIN: CRP: 16.3 mg/dL — ABNORMAL HIGH (ref <1.0)

## 2020-01-04 MED ORDER — ONDANSETRON HCL 4 MG/2ML IJ SOLN: 4.0000 mg | Freq: Four times a day (QID) | INTRAMUSCULAR | Status: DC | PRN

## 2020-01-04 MED ORDER — SODIUM CHLORIDE 0.9 % IV SOLN
200.0000 mg | Freq: Once | INTRAVENOUS | Status: AC
  Administered 2020-01-04: 200 mg via INTRAVENOUS
  Filled 2020-01-04: qty 200

## 2020-01-04 MED ORDER — DEXAMETHASONE SODIUM PHOSPHATE 10 MG/ML IJ SOLN
10.0000 mg | Freq: Once | INTRAMUSCULAR | Status: DC
  Filled 2020-01-04: qty 1

## 2020-01-04 MED ORDER — POTASSIUM CHLORIDE CRYS ER 20 MEQ PO TBCR
40.0000 meq | EXTENDED_RELEASE_TABLET | Freq: Once | ORAL | Status: AC
  Administered 2020-01-04: 40 meq via ORAL
  Filled 2020-01-04: qty 2

## 2020-01-04 MED ORDER — ASCORBIC ACID 500 MG PO TABS
500.0000 mg | ORAL_TABLET | Freq: Every day | ORAL | Status: DC
  Administered 2020-01-04 – 2020-01-08 (×5): 500 mg via ORAL
  Filled 2020-01-04 (×4): qty 1

## 2020-01-04 MED ORDER — ALBUTEROL SULFATE HFA 108 (90 BASE) MCG/ACT IN AERS
2.0000 | INHALATION_SPRAY | Freq: Four times a day (QID) | RESPIRATORY_TRACT | Status: DC
  Administered 2020-01-04 (×3): 2 via RESPIRATORY_TRACT
  Filled 2020-01-04: qty 6.7

## 2020-01-04 MED ORDER — ENOXAPARIN SODIUM 60 MG/0.6ML ~~LOC~~ SOLN
55.0000 mg | Freq: Two times a day (BID) | SUBCUTANEOUS | Status: DC
  Administered 2020-01-04: 55 mg via SUBCUTANEOUS
  Filled 2020-01-04 (×4): qty 0.6

## 2020-01-04 MED ORDER — SODIUM CHLORIDE 0.9% FLUSH: 3.0000 mL | INTRAVENOUS | Status: DC | PRN

## 2020-01-04 MED ORDER — ALBUTEROL SULFATE HFA 108 (90 BASE) MCG/ACT IN AERS
2.0000 | INHALATION_SPRAY | Freq: Four times a day (QID) | RESPIRATORY_TRACT | Status: DC | PRN
  Administered 2020-01-06 – 2020-01-08 (×2): 2 via RESPIRATORY_TRACT
  Filled 2020-01-04: qty 6.7

## 2020-01-04 MED ORDER — ENOXAPARIN SODIUM 40 MG/0.4ML ~~LOC~~ SOLN
40.0000 mg | Freq: Two times a day (BID) | SUBCUTANEOUS | Status: DC
  Administered 2020-01-04: 40 mg via SUBCUTANEOUS
  Filled 2020-01-04: qty 0.4

## 2020-01-04 MED ORDER — ACETAMINOPHEN 325 MG PO TABS: 650.0000 mg | ORAL_TABLET | Freq: Four times a day (QID) | ORAL | Status: DC | PRN

## 2020-01-04 MED ORDER — SODIUM CHLORIDE 0.9% FLUSH
3.0000 mL | Freq: Two times a day (BID) | INTRAVENOUS | Status: DC
  Administered 2020-01-04 – 2020-01-08 (×8): 3 mL via INTRAVENOUS

## 2020-01-04 MED ORDER — SODIUM CHLORIDE 0.9 % IV SOLN
250.0000 mL | INTRAVENOUS | Status: DC | PRN
  Administered 2020-01-05: 09:00:00 250 mL via INTRAVENOUS

## 2020-01-04 MED ORDER — SODIUM CHLORIDE 0.9 % IV SOLN
100.0000 mg | Freq: Every day | INTRAVENOUS | Status: AC
  Administered 2020-01-05 – 2020-01-08 (×4): 100 mg via INTRAVENOUS
  Filled 2020-01-04 (×5): qty 20

## 2020-01-04 MED ORDER — DEXAMETHASONE SODIUM PHOSPHATE 10 MG/ML IJ SOLN
6.0000 mg | INTRAMUSCULAR | Status: DC
  Administered 2020-01-04: 6 mg via INTRAVENOUS

## 2020-01-04 MED ORDER — SODIUM CHLORIDE 0.9 % IV BOLUS
1000.0000 mL | Freq: Once | INTRAVENOUS | Status: AC
Start: 2020-01-04 — End: 2020-01-04
  Administered 2020-01-04: 1000 mL via INTRAVENOUS

## 2020-01-04 MED ORDER — ZINC SULFATE 220 (50 ZN) MG PO CAPS
220.0000 mg | ORAL_CAPSULE | Freq: Every day | ORAL | Status: DC
  Administered 2020-01-04 – 2020-01-08 (×5): 220 mg via ORAL
  Filled 2020-01-04 (×5): qty 1

## 2020-01-04 MED ORDER — ONDANSETRON HCL 4 MG PO TABS: 4.0000 mg | ORAL_TABLET | Freq: Four times a day (QID) | ORAL | Status: DC | PRN

## 2020-01-04 MED ORDER — GUAIFENESIN-DM 100-10 MG/5ML PO SYRP
10.0000 mL | ORAL_SOLUTION | ORAL | Status: DC | PRN
  Filled 2020-01-04: qty 10

## 2020-01-04 MED ORDER — MECLIZINE HCL 25 MG PO TABS
25.0000 mg | ORAL_TABLET | Freq: Three times a day (TID) | ORAL | Status: DC | PRN
  Filled 2020-01-04: qty 1

## 2020-01-04 NOTE — H&P (Signed)
History and Physical    Dorothy Patton EAV:[id number] DOB: 1955-10-17 DOA: 01/04/2020  PCP: [facility]   Patient coming from: Home  I have personally briefly reviewed patient's old medical records in [facility]  Chief Complaint: Shortness of breath  HPI: Dorothy Patton is a 64 y.o. female with medical history significant for hypertension, arthritis and migraine headaches who presented to the ED for evaluation of shortness of breath.  Patient reported that she started having symptoms of cough and malaise about a week and half ago but tested positive for the COVID-19 virus on 12/31/19.  She complains of shortness of breath that has progressively worsened to the point that she is now short of breath at rest. Shortness of breath is associated with pleuritic chest pain and a non productive cough. She denies having fever, no nausea, no vomiting, no diarrhea or abdominal pain. Patient received 2 doses of the COVID 19 vaccine in April, 2021. Labs show sodium 127, potassium, bicarb 22, glucose 139, BUN 10, creatinine 0.63, calcium 8.5, AST 35, ALT 23, WBC 8.7, hemoglobin 12.3, hematocrit 37, platelet count 234 Chest x-ray reviewed by me shows extensive patchy bilateral airspace disease compatible with pneumonia. Twelve-lead EKG shows normal sinus rhythm   ED Course: Patient is a 64 year old female who presents to the ER for evaluation of worsening shortness of breath.  She tested positive for the COVID-19 virus on 12/31/19 but has had symptoms for over a week.  Chest x-ray shows patchy bilateral airspace disease compatible with pneumonia.  On room air at rest she has a pulse oximetry of 95% but is tachypneic with respiratory rate between 24 and 30.  She is currently on 2 L of oxygen.  She will be admitted to the hospital for further evaluation.  Review of Systems: As per HPI otherwise 10 point review of systems negative.    Past Medical History:  Diagnosis Date  .  Arthritis   . Hypertension   . Migraines   . Vitamin D deficiency     Past Surgical History:  Procedure Laterality Date  . CARPAL TUNNEL RELEASE    . ROTATOR CUFF REPAIR       reports that she has never smoked. She has never used smokeless tobacco. She reports that she does not drink alcohol. No history on file for drug use.  No Known Allergies  Family History  Problem Relation Age of Onset  . Breast cancer Neg Hx      Prior to Admission medications   Medication Sig Start Date End Date Taking? Authorizing Provider  albuterol (PROVENTIL HFA;VENTOLIN HFA) 108 (90 Base) MCG/ACT inhaler Inhale 2 puffs into the lungs every 6 (six) hours as needed for wheezing or shortness of breath. 01/03/16   Schaevitz, Myra Rude, MD  meclizine (ANTIVERT) 25 MG tablet Take 1 tablet (25 mg total) by mouth 3 (three) times daily as needed for dizziness. 03/16/16   Charlynne Pander, MD    Physical Exam: Vitals:   01/03/20 2022 01/03/20 2027 01/04/20 0509 01/04/20 0806  BP: 118/69  124/77 131/70  Pulse: (!) 101  87 86  Resp: (!) 24  (!) 22 (!) 30  Temp: 98.8 F (37.1 C)  98.4 F (36.9 C) 98.8 F (37.1 C)  TempSrc: Oral  Oral Oral  SpO2: 95%  94% 97%  Weight:  108.4 kg    Height:  5\' 2"  (1.575 m)       Vitals:   01/03/20 2022 01/03/20 2027 01/04/20  0509 01/04/20 0806  BP: 118/69  124/77 131/70  Pulse: (!) 101  87 86  Resp: (!) 24  (!) 22 (!) 30  Temp: 98.8 F (37.1 C)  98.4 F (36.9 C) 98.8 F (37.1 C)  TempSrc: Oral  Oral Oral  SpO2: 95%  94% 97%  Weight:  108.4 kg    Height:  5\' 2"  (1.575 m)      Constitutional: NAD, alert and oriented x 3.  Acutely ill-appearing, Obese Eyes: PERRL, lids and conjunctivae pallor ENMT: Mucous membranes are moist.  Neck: normal, supple, no masses, no thyromegaly Respiratory: Bilateral air entry, conversational dyspnea , no wheezing, no crackles. Normal respiratory effort. No accessory muscle use.  Cardiovascular: Regular rate and rhythm, no  murmurs / rubs / gallops. No extremity edema. 2+ pedal pulses. No carotid bruits.  Abdomen: no tenderness, no masses palpated. No hepatosplenomegaly. Bowel sounds positive.  Musculoskeletal: no clubbing / cyanosis. No joint deformity upper and lower extremities.  Skin: no rashes, lesions, ulcers.  Neurologic: No gross focal neurologic deficit. Psychiatric: Normal mood and affect.   Labs on Admission: I have personally reviewed following labs and imaging studies  CBC: Recent Labs  Lab 01/03/20 2043  WBC 8.7  NEUTROABS 7.8*  HGB 12.3  HCT 37.0  MCV 79.7*  PLT 234   Basic Metabolic Panel: Recent Labs  Lab 01/03/20 2043  NA 127*  K 3.4*  CL 93*  CO2 22  GLUCOSE 139*  BUN 10  CREATININE 0.63  CALCIUM 8.5*   GFR: Estimated Creatinine Clearance: 84.5 mL/min (by C-G formula based on SCr of 0.63 mg/dL). Liver Function Tests: Recent Labs  Lab 01/03/20 2043  AST 35  ALT 23  ALKPHOS 63  BILITOT 0.6  PROT 7.5  ALBUMIN 3.1*   No results for input(s): LIPASE, AMYLASE in the last 168 hours. No results for input(s): AMMONIA in the last 168 hours. Coagulation Profile: No results for input(s): INR, PROTIME in the last 168 hours. Cardiac Enzymes: No results for input(s): CKTOTAL, CKMB, CKMBINDEX, TROPONINI in the last 168 hours. BNP (last 3 results) No results for input(s): PROBNP in the last 8760 hours. HbA1C: No results for input(s): HGBA1C in the last 72 hours. CBG: No results for input(s): GLUCAP in the last 168 hours. Lipid Profile: No results for input(s): CHOL, HDL, LDLCALC, TRIG, CHOLHDL, LDLDIRECT in the last 72 hours. Thyroid Function Tests: No results for input(s): TSH, T4TOTAL, FREET4, T3FREE, THYROIDAB in the last 72 hours. Anemia Panel: No results for input(s): VITAMINB12, FOLATE, FERRITIN, TIBC, IRON, RETICCTPCT in the last 72 hours. Urine analysis: No results found for: COLORURINE, APPEARANCEUR, LABSPEC, PHURINE, GLUCOSEU, HGBUR, BILIRUBINUR, KETONESUR,  PROTEINUR, UROBILINOGEN, NITRITE, LEUKOCYTESUR  Radiological Exams on Admission: DG Chest 2 View  Result Date: 01/03/2020 CLINICAL DATA:  Shortness of breath EXAM: CHEST - 2 VIEW COMPARISON:  01/02/2016 FINDINGS: Patchy bilateral airspace disease. Low lung volumes. Heart is normal size. No effusions or acute bony abnormality. IMPRESSION: Extensive patchy bilateral airspace disease compatible with pneumonia. COVID pneumonia could have this appearance. Electronically Signed   By: 03/03/2016 M.D.   On: 01/03/2020 21:22    EKG: Independently reviewed.  Normal sinus rhythm  Assessment/Plan Principal Problem:   Pneumonia due to COVID-19 virus Active Problems:   Hyponatremia   Hypokalemia    Pneumonia due to COVID-19 virus Patient presents for evaluation of worsening shortness of breath associated with pleuritic chest pain and a nonproductive cough She received 2 doses of the COVID-19 vaccine in April She  tested positive for the COVID-19 virus on 12/31/19 Chest x-ray shows findings suggestive of viral pneumonia We will place patient on Decadron Place patient on remdesivir per protocol   Hyponatremia Secondary to poor oral intake   Hypokalemia Supplement potassium    DVT prophylaxis: Lovenox Code Status: Full code Family Communication: Greater than 50% of time was spent discussing plan of care with patient at the bedside.  All questions and concerns have been addressed.  She verbalizes understanding and agrees with the plan Disposition Plan: Back to previous home environment Consults called: None    Junior Kenedy MD [facility]     01/04/2020, 9:59 AM

## 2020-01-04 NOTE — ED Notes (Signed)
Pt up to void on toilet.

## 2020-01-04 NOTE — ED Notes (Signed)
Pt put on 4L/min O2 per [place] per ED provider.

## 2020-01-04 NOTE — ED Notes (Signed)
Pt ambulatory to toilet to urinate. Requested the lights be dimmed for comfort. Denies other needs at this time.

## 2020-01-04 NOTE — ED Notes (Signed)
Pt ambulatory to toilet to urinate. Steady gait noted. Does not appear as SOB as before.

## 2020-01-04 NOTE — ED Provider Notes (Signed)
[facility] Emergency Department Provider Note   ____________________________________________   First MD Initiated Contact with Patient 01/04/20 0915     (approximate)  I have reviewed the triage vital signs and the nursing notes.   HISTORY  Chief Complaint Shortness of Breath    HPI Dorothy Patton is a 64 y.o. female with past medical history of hypertension, arthritis, and migraines who presents to the ED complaining of shortness of breath.  Patient reports that she initially started having cough and malaise about a week and a half ago, subsequently tested positive for Covid 4 days ago on September 1.  Her breathing has progressively worsened since then and she states she has been unable to catch her breath ever since she woke up this morning.  Breathing is particularly bad when she goes to walk and she has been able to move around much due to her symptoms.  She has sharp pain in her chest when she coughs, but her cough is nonproductive and she denies any fevers.  She has not had any vomiting, diarrhea, or abdominal pain.  She states she has had 2 doses of the Covid vaccine back in April.        Past Medical History:  Diagnosis Date  . Arthritis   . Hypertension   . Migraines   . Vitamin D deficiency     Patient Active Problem List   Diagnosis Date Noted  . Hyponatremia 01/04/2020  . Pneumonia due to COVID-19 virus 01/04/2020  . Hypokalemia 01/04/2020    Past Surgical History:  Procedure Laterality Date  . CARPAL TUNNEL RELEASE    . ROTATOR CUFF REPAIR      Prior to Admission medications   Medication Sig Start Date End Date Taking? Authorizing Provider  albuterol (PROVENTIL HFA;VENTOLIN HFA) 108 (90 Base) MCG/ACT inhaler Inhale 2 puffs into the lungs every 6 (six) hours as needed for wheezing or shortness of breath. 01/03/16   Schaevitz, Myra Rude, MD  meclizine (ANTIVERT) 25 MG tablet Take 1 tablet (25 mg total) by mouth 3 (three) times  daily as needed for dizziness. 03/16/16   Charlynne Pander, MD    Allergies Patient has no known allergies.  Family History  Problem Relation Age of Onset  . Breast cancer Neg Hx     Social History Social History   Tobacco Use  . Smoking status: Never Smoker  . Smokeless tobacco: Never Used  Substance Use Topics  . Alcohol use: No  . Drug use: Not on file    Review of Systems  Constitutional: No fever/chills Eyes: No visual changes. ENT: No sore throat. Cardiovascular: Positive for chest pain. Respiratory: Positive for cough and shortness of breath. Gastrointestinal: No abdominal pain.  No nausea, no vomiting.  No diarrhea.  No constipation. Genitourinary: Negative for dysuria. Musculoskeletal: Negative for back pain. Skin: Negative for rash. Neurological: Negative for headaches, focal weakness or numbness.  ____________________________________________   PHYSICAL EXAM:  VITAL SIGNS: ED Triage Vitals  Enc Vitals Group     BP 01/03/20 2022 118/69     Pulse Rate 01/03/20 2022 (!) 101     Resp 01/03/20 2022 (!) 24     Temp 01/03/20 2022 98.8 F (37.1 C)     Temp Source 01/03/20 2022 Oral     SpO2 01/03/20 2022 95 %     Weight 01/03/20 2027 239 lb (108.4 kg)     Height 01/03/20 2027 5\' 2"  (1.575 m)     Head  Circumference --      Peak Flow --      Pain Score 01/03/20 2026 9     Pain Loc --      Pain Edu? --      Excl. in GC? --     Constitutional: Alert and oriented. Eyes: Conjunctivae are normal. Head: Atraumatic. Nose: No congestion/rhinnorhea. Mouth/Throat: Mucous membranes are moist. Neck: Normal ROM Cardiovascular: Normal rate, regular rhythm. Grossly normal heart sounds. Respiratory: Tachypneic with increased respiratory effort.  No retractions. Lungs CTAB. Gastrointestinal: Soft and nontender. No distention. Genitourinary: deferred Musculoskeletal: No lower extremity tenderness nor edema. Neurologic:  Normal speech and language. No gross focal  neurologic deficits are appreciated. Skin:  Skin is warm, dry and intact. No rash noted. Psychiatric: Mood and affect are normal. Speech and behavior are normal.  ____________________________________________   LABS (all labs ordered are listed, but only abnormal results are displayed)  Labs Reviewed  CBC WITH DIFFERENTIAL/PLATELET - Abnormal; Notable for the following components:      Result Value   MCV 79.7 (*)    Neutro Abs 7.8 (*)    Lymphs Abs 0.5 (*)    All other components within normal limits  COMPREHENSIVE METABOLIC PANEL - Abnormal; Notable for the following components:   Sodium 127 (*)    Potassium 3.4 (*)    Chloride 93 (*)    Glucose, Bld 139 (*)    Calcium 8.5 (*)    Albumin 3.1 (*)    All other components within normal limits  SARS CORONAVIRUS 2 BY RT PCR (HOSPITAL ORDER, PERFORMED IN [facility])  FERRITIN  C-REACTIVE PROTEIN  FIBRIN DERIVATIVES D-DIMER ([facility] ONLY)  TROPONIN I (HIGH SENSITIVITY)  TROPONIN I (HIGH SENSITIVITY)   ____________________________________________  EKG  ED ECG REPORT I, Chesley Noon, the attending physician, personally viewed and interpreted this ECG.   Date: 01/04/2020  EKG Time: 20:34  Rate: 99  Rhythm: normal sinus rhythm  Axis: LAD  Intervals:none  ST&T Change: None   PROCEDURES  Procedure(s) performed (including Critical Care):  Procedures   ____________________________________________   INITIAL IMPRESSION / ASSESSMENT AND PLAN / ED COURSE       64 year old female with past medical history of hypertension, arthritis, and migraines who presents to the ED complaining of increasing shortness of breath ever since she tested positive for COVID-19 on September 1.  She has significant tachypnea with respiratory rate in the high 30s along with increased work of breathing, however she is maintaining her O2 sats on room air.  She was placed on 2 L nasal cannula for comfort and chest x-ray shows what appears  to be Covid pneumonia.  We will confirm her Covid status with PCR testing here.  Lab work is remarkable for hyponatremia and we will hydrate with normal saline bolus.  EKG and troponin are unremarkable.  Anticipate admission due to her marked increased work of breathing.  Patient's work of breathing and respiratory rate are slightly improved on supplemental oxygen.  Case discussed with hospitalist for admission.      ____________________________________________   FINAL CLINICAL IMPRESSION(S) / ED DIAGNOSES  Final diagnoses:  Pneumonia due to COVID-19 virus  SOB (shortness of breath)     ED Discharge Orders    None       Note:  This document was prepared using Dragon voice recognition software and may include unintentional dictation errors.   Chesley Noon, MD 01/04/20 1001

## 2020-01-04 NOTE — ED Notes (Signed)
Pt provided supper tray. Able to feed self independently.

## 2020-01-04 NOTE — ED Notes (Signed)
Pt assisted to the bathroom by this RN and Hospital doctor, Charity fundraiser. Pt back to bed without incident. Pt tolerated well.

## 2020-01-04 NOTE — ED Notes (Signed)
Report from rebekah, rn.  

## 2020-01-05 LAB — CBC WITH DIFFERENTIAL/PLATELET
Abs Immature Granulocytes: 0.1 10*3/uL — ABNORMAL HIGH (ref 0.00–0.07)
Basophils Absolute: 0 10*3/uL (ref 0.0–0.1)
Basophils Relative: 0 %
Eosinophils Absolute: 0 10*3/uL (ref 0.0–0.5)
Eosinophils Relative: 0 %
HCT: 35.2 % — ABNORMAL LOW (ref 36.0–46.0)
Hemoglobin: 12.3 g/dL (ref 12.0–15.0)
Immature Granulocytes: 2 %
Lymphocytes Relative: 10 %
Lymphs Abs: 0.7 10*3/uL (ref 0.7–4.0)
MCH: 27.3 pg (ref 26.0–34.0)
MCHC: 34.9 g/dL (ref 30.0–36.0)
MCV: 78 fL — ABNORMAL LOW (ref 80.0–100.0)
Monocytes Absolute: 0.3 10*3/uL (ref 0.1–1.0)
Monocytes Relative: 5 %
Neutro Abs: 5.3 10*3/uL (ref 1.7–7.7)
Neutrophils Relative %: 83 %
Platelets: 294 10*3/uL (ref 150–400)
RBC: 4.51 MIL/uL (ref 3.87–5.11)
RDW: 15.9 % — ABNORMAL HIGH (ref 11.5–15.5)
WBC: 6.4 10*3/uL (ref 4.0–10.5)
nRBC: 0 % (ref 0.0–0.2)

## 2020-01-05 LAB — FIBRIN DERIVATIVES D-DIMER (ARMC ONLY): Fibrin derivatives D-dimer (ARMC): 710.93 ng/mL (FEU) — ABNORMAL HIGH (ref 0.00–499.00)

## 2020-01-05 LAB — COMPREHENSIVE METABOLIC PANEL
ALT: 26 U/L (ref 0–44)
AST: 40 U/L (ref 15–41)
Albumin: 2.8 g/dL — ABNORMAL LOW (ref 3.5–5.0)
Alkaline Phosphatase: 59 U/L (ref 38–126)
Anion gap: 10 (ref 5–15)
BUN: 22 mg/dL (ref 8–23)
CO2: 23 mmol/L (ref 22–32)
Calcium: 8.6 mg/dL — ABNORMAL LOW (ref 8.9–10.3)
Chloride: 100 mmol/L (ref 98–111)
Creatinine, Ser: 0.58 mg/dL (ref 0.44–1.00)
GFR calc Af Amer: >60 mL/min (ref >60)
GFR calc non Af Amer: >60 mL/min (ref >60)
Glucose, Bld: 195 mg/dL — ABNORMAL HIGH (ref 70–99)
Potassium: 4.2 mmol/L (ref 3.5–5.1)
Sodium: 133 mmol/L — ABNORMAL LOW (ref 135–145)
Total Bilirubin: 0.5 mg/dL (ref 0.3–1.2)
Total Protein: 7.4 g/dL (ref 6.5–8.1)

## 2020-01-05 LAB — MAGNESIUM: Magnesium: 2.1 mg/dL (ref 1.7–2.4)

## 2020-01-05 LAB — C-REACTIVE PROTEIN: CRP: 11.2 mg/dL — ABNORMAL HIGH (ref <1.0)

## 2020-01-05 LAB — PHOSPHORUS: Phosphorus: 2.7 mg/dL (ref 2.5–4.6)

## 2020-01-05 LAB — SARS CORONAVIRUS 2 BY RT PCR (HOSPITAL ORDER, PERFORMED IN ~~LOC~~ HOSPITAL LAB): SARS Coronavirus 2: POSITIVE — AB

## 2020-01-05 LAB — FERRITIN: Ferritin: 167 ng/mL (ref 11–307)

## 2020-01-05 MED ORDER — ENOXAPARIN SODIUM 60 MG/0.6ML ~~LOC~~ SOLN
55.0000 mg | SUBCUTANEOUS | Status: DC
  Administered 2020-01-05 – 2020-01-07 (×3): 55 mg via SUBCUTANEOUS
  Filled 2020-01-05 (×4): qty 0.6

## 2020-01-05 MED ORDER — IPRATROPIUM-ALBUTEROL 20-100 MCG/ACT IN AERS
1.0000 | INHALATION_SPRAY | Freq: Four times a day (QID) | RESPIRATORY_TRACT | Status: DC
  Administered 2020-01-05 – 2020-01-08 (×13): 1 via RESPIRATORY_TRACT
  Filled 2020-01-05: qty 4

## 2020-01-05 MED ORDER — BARICITINIB 2 MG PO TABS
4.0000 mg | ORAL_TABLET | Freq: Every day | ORAL | Status: DC
  Administered 2020-01-05 – 2020-01-08 (×4): 4 mg via ORAL
  Filled 2020-01-05 (×4): qty 2

## 2020-01-05 MED ORDER — PANTOPRAZOLE SODIUM 40 MG PO TBEC
40.0000 mg | DELAYED_RELEASE_TABLET | Freq: Every day | ORAL | Status: DC
  Administered 2020-01-05 – 2020-01-08 (×4): 40 mg via ORAL
  Filled 2020-01-05 (×4): qty 1

## 2020-01-05 MED ORDER — ENOXAPARIN SODIUM 60 MG/0.6ML ~~LOC~~ SOLN: 55.0000 mg | SUBCUTANEOUS | Status: DC

## 2020-01-05 MED ORDER — GUAIFENESIN ER 600 MG PO TB12
1200.0000 mg | ORAL_TABLET | Freq: Two times a day (BID) | ORAL | Status: DC
  Administered 2020-01-05 – 2020-01-08 (×7): 1200 mg via ORAL
  Filled 2020-01-05 (×7): qty 2

## 2020-01-05 MED ORDER — METHYLPREDNISOLONE SODIUM SUCC 125 MG IJ SOLR
60.0000 mg | Freq: Two times a day (BID) | INTRAMUSCULAR | Status: DC
  Administered 2020-01-05 – 2020-01-08 (×7): 60 mg via INTRAVENOUS
  Filled 2020-01-05 (×8): qty 2

## 2020-01-05 MED ORDER — LORATADINE 10 MG PO TABS
10.0000 mg | ORAL_TABLET | Freq: Every day | ORAL | Status: DC
  Administered 2020-01-05 – 2020-01-08 (×4): 10 mg via ORAL
  Filled 2020-01-05 (×4): qty 1

## 2020-01-05 MED ORDER — FLUTICASONE PROPIONATE 50 MCG/ACT NA SUSP
2.0000 | Freq: Every day | NASAL | Status: DC
  Administered 2020-01-05 – 2020-01-08 (×4): 2 via NASAL
  Filled 2020-01-05: qty 16

## 2020-01-05 NOTE — Evaluation (Signed)
Occupational Therapy Evaluation Patient Details Name: Dorothy Patton MRN: [id number] DOB: [birth year] Today's Date: 01/05/2020    History of Present Illness Dorothy Patton is a 64yoF who [facility] c SOB, admitted c COVID19.   Clinical Impression   Pt was seen for OT evaluation this date. Prior to hospital admission, pt was Indep with all aspects of self care and fxl mobility (reports having a cane, but not using). Pt lives with daughter and grandchildren in [facility] with 5 STE (front) and ramped entrance (back). Currently pt demonstrates impairments as described below (See OT problem list) which functionally limit her ability to perform ADL/self-care tasks. Pt currently requires SBA with standing ADLs and fxl mobility with no AD, purely 2/2 decreased fxl activity tolerance.  Pt would benefit from skilled OT in acute setting to address energy conservation in order to maximize safety and independence while minimizing falls risk and caregiver burden. Upon hospital discharge, do not anticipate that pt will require OT follow up.     Follow Up Recommendations  No OT follow up    Equipment Recommendations  None recommended by OT    Recommendations for Other Services       Precautions / Restrictions Precautions Precautions: None Restrictions Weight Bearing Restrictions: No      Mobility Bed Mobility Overal bed mobility: Independent                Transfers Overall transfer level: Modified independent Equipment used: None             General transfer comment: Pt demos good control and safety awareness with transfers.    Balance Overall balance assessment: Independent                                         ADL either performed or assessed with clinical judgement   ADL Overall ADL's : Needs assistance/impaired                                       General ADL Comments: able to perform seated UB ADLs with INDEP, seated LB ADLs with MOD I  (increased time). MOD I for ADL transfers with no AD. SBA for standing UB/LB ADLs. SBA/CGA for fxl mobility with no AD d/t decreased tolerance.     Vision Patient Visual Report: No change from baseline       Perception     Praxis      Pertinent Vitals/Pain Pain Assessment: No/denies pain     Hand Dominance     Extremity/Trunk Assessment Upper Extremity Assessment Upper Extremity Assessment: Overall WFL for tasks assessed   Lower Extremity Assessment Lower Extremity Assessment: Overall WFL for tasks assessed   Cervical / Trunk Assessment Cervical / Trunk Assessment: Normal   Communication Communication Communication: No difficulties;Interpreter utilized [place], [id number]. But pt also demos moderate understanding of English, answering/speaking over interpreter.)   Cognition Arousal/Alertness: Awake/alert Behavior During Therapy: WFL for tasks assessed/performed Overall Cognitive Status: Within Functional Limits for tasks assessed                                     General Comments       Exercises Other Exercises Other Exercises: Pt with decreased fxl activity tolerance.  She is on 2Lnc throughout and desats from 98% to 90% within 2 mins of standing functional activity. Pt reports this is far below her usual endurance. Other Exercises: OT educates re: role of OT, safety and EC considerations. Would benefit from f/u re: EC.   Shoulder Instructions      Home Living Family/patient expects to be discharged to:: Private residence Living Arrangements: Children (daughter and grandchildren) Available Help at Discharge: Family Type of Home: House Home Access: Stairs to enter [employer] of Steps: 5 STE at front, ramp to back entrance   Home Layout: One level               Home Equipment: Cane - single point   Additional Comments: doesn't use cane      Prior Functioning/Environment Level of Independence: Independent        Comments: no  limitations at baseline per patient, no difficulty with balance, no DME needs.        OT Problem List: Decreased strength;Decreased activity tolerance;Cardiopulmonary status limiting activity      OT Treatment/Interventions: Self-care/ADL training;DME and/or AE instruction;Balance training;Therapeutic activities;Therapeutic exercise;Energy conservation;Patient/family education    OT Goals(Current goals can be found in the care plan section) Acute Rehab OT Goals Patient Stated Goal: be able to breathe more easily OT Goal Formulation: With patient Time For Goal Achievement: 01/19/20 Potential to Achieve Goals: Good ADL Goals Pt Will Transfer to Toilet: with modified independence;ambulating;regular height toilet;grab bars Pt Will Perform Toileting - Clothing Manipulation and hygiene: with modified independence;sit to/from stand Pt/caregiver will Perform Home Exercise Program: Increased strength;Both right and left upper extremity;With Supervision Additional ADL Goal #1: Pt will demo implementation of 1-3 EC strategies during daily activities with 0% cues.  OT Frequency: Min 1X/week   Barriers to D/C:            Co-evaluation              AM-PAC OT "6 Clicks" Daily Activity     Outcome Measure Help from another person eating meals?: None Help from another person taking care of personal grooming?: None Help from another person toileting, which includes using toliet, bedpan, or urinal?: A Little Help from another person bathing (including washing, rinsing, drying)?: A Little Help from another person to put on and taking off regular upper body clothing?: None Help from another person to put on and taking off regular lower body clothing?: A Little 6 Click Score: 21   End of Session Equipment Utilized During Treatment: Gait belt Nurse Communication: Mobility status;Other (comment) (oxygen status)  Activity Tolerance: Patient tolerated treatment well Patient left: in chair;with  call bell/phone within reach  OT Visit Diagnosis: Muscle weakness (generalized) (M62.81)                Time: 6010-9323 OT Time Calculation (min): 42 min Charges:  OT General Charges $OT Visit: 1 Visit OT Evaluation $OT Eval Moderate Complexity: 1 Mod OT Treatments $Self Care/Home Management : 8-22 mins $Therapeutic Activity: 8-22 mins  Rejeana Brock, MS, OTR/L ascom [phone] 01/05/20, 3:49 PM

## 2020-01-05 NOTE — Evaluation (Signed)
Physical Therapy Evaluation Patient Details Name: Dorothy Patton MRN: [id number] DOB: 11/07/1955 Today's Date: 01/05/2020   History of Present Illness  Dorothy Patton is a 64yoF who comes to [facility] c SOB, admitted c COVID19.  Clinical Impression  Pt admitted with above diagnosis. Pt currently with functional limitations due to the deficits listed below (see "PT Problem List"). Upon entry, pt in bed, awake and agreeable to participate. The pt is alert and oriented x4, pleasant, conversational, and generally a good historian. Pt performs bed mobility and transfers with independence. Pt is limited in her ability to AMB, tolerating only limited household distances on 2L/min O2, but still arrested by dyspnea, requiring a 3 minutes rest interval between walking intervals. Functional mobility assessment demonstrates increased effort/time requirements, poor tolerance, and need for physical assistance, whereas the patient performed these at a higher level of independence PTA. Pt will benefit from skilled PT intervention to increase independence and safety with basic mobility in preparation for discharge to the venue listed below.       Follow Up Recommendations No PT follow up    Equipment Recommendations  None recommended by PT    Recommendations for Other Services       Precautions / Restrictions Precautions Precautions: None Restrictions Weight Bearing Restrictions: No      Mobility  Bed Mobility Overal bed mobility: Independent                Transfers Overall transfer level: Modified independent Equipment used: None             General transfer comment: slow, cautious, safe  Ambulation/Gait   Gait Distance (Feet): 35 Feet (2x41ft, with 3 minutes rest interval between) Assistive device: None       General Gait Details: slow, cautious, AMB aggravates dyspnea, although SpO2 remains >95% on 2L/min, HR 100sBPM, denies dizziness  Stairs            Wheelchair  Mobility    Modified Rankin (Stroke Patients Only)       Balance Overall balance assessment: Independent                                           Pertinent Vitals/Pain Pain Assessment: No/denies pain    Home Living Family/patient expects to be discharged to:: Private residence Living Arrangements: Children (DTR, grandchilddren) Available Help at Discharge: Family Type of Home: House       Home Layout: One level Home Equipment: None      Prior Function Level of Independence: Independent         Comments: no limitations at baseline per patient, no difficulty with balance, no DME needs.     Hand Dominance        Extremity/Trunk Assessment   Upper Extremity Assessment Upper Extremity Assessment: Overall WFL for tasks assessed    Lower Extremity Assessment Lower Extremity Assessment: Overall WFL for tasks assessed    Cervical / Trunk Assessment Cervical / Trunk Assessment: Normal  Communication   Communication: No difficulties  Cognition Arousal/Alertness: Awake/alert Behavior During Therapy: WFL for tasks assessed/performed Overall Cognitive Status: Within Functional Limits for tasks assessed                                        General Comments  Exercises     Assessment/Plan    PT Assessment Patient needs continued PT services  PT Problem List Decreased mobility;Decreased activity tolerance;Cardiopulmonary status limiting activity       PT Treatment Interventions Gait training;Functional mobility training;Therapeutic activities;Therapeutic exercise;Patient/family education    PT Goals (Current goals can be found in the Care Plan section)  Acute Rehab PT Goals Patient Stated Goal: be able to breathe more easily PT Goal Formulation: With patient Time For Goal Achievement: 01/19/20 Potential to Achieve Goals: Good    Frequency Min 2X/week   Barriers to discharge        Co-evaluation                AM-PAC PT "6 Clicks" Mobility  Outcome Measure Help needed turning from your back to your side while in a flat bed without using bedrails?: None Help needed moving from lying on your back to sitting on the side of a flat bed without using bedrails?: None Help needed moving to and from a bed to a chair (including a wheelchair)?: None Help needed standing up from a chair using your arms (e.g., wheelchair or bedside chair)?: None Help needed to walk in hospital room?: None Help needed climbing 3-5 steps with a railing? : A Little 6 Click Score: 23    End of Session Equipment Utilized During Treatment: Oxygen Activity Tolerance: Patient tolerated treatment well;No increased pain;Treatment limited secondary to agitation Patient left: in chair;with chair alarm set;with family/visitor present Nurse Communication: Mobility status PT Visit Diagnosis: Difficulty in walking, not elsewhere classified (R26.2);Other abnormalities of gait and mobility (R26.89)    Time: 0881-1031 PT Time Calculation (min) (ACUTE ONLY): 23 min   Charges:   PT Evaluation $PT Eval Low Complexity: 1 Low         3:37 PM, 01/05/20 Rosamaria Lints, PT, DPT Physical Therapist - [facility]  [phone] (ASCOM)    Marlene Beidler C 01/05/2020, 3:35 PM

## 2020-01-05 NOTE — ED Notes (Signed)
Lab was notified at 0120 by jann, ed tech of need for venipuncture assist for am labs.

## 2020-01-05 NOTE — Progress Notes (Signed)
PROGRESS NOTE    Dorothy Patton  AYT:[id number] DOB: 1955/05/04 DOA: 01/04/2020 PCP: [facility], [facility]    Chief Complaint  Patient presents with   Shortness of Breath    Brief Narrative:  HPI per Dr. Andre Lefort is a 64 y.o. female with medical history significant for hypertension, arthritis and migraine headaches who presented to the ED for evaluation of shortness of breath.  Patient reported that she started having symptoms of cough and malaise about a week and half ago but tested positive for the COVID-19 virus on 12/31/19.  She complains of shortness of breath that has progressively worsened to the point that she is now short of breath at rest. Shortness of breath is associated with pleuritic chest pain and a non productive cough. She denies having fever, no nausea, no vomiting, no diarrhea or abdominal pain. Patient received 2 doses of the COVID 19 vaccine in April, 2021. Labs show sodium 127, potassium, bicarb 22, glucose 139, BUN 10, creatinine 0.63, calcium 8.5, AST 35, ALT 23, WBC 8.7, hemoglobin 12.3, hematocrit 37, platelet count 234 Chest x-ray reviewed by me shows extensive patchy bilateral airspace disease compatible with pneumonia. Twelve-lead EKG shows normal sinus rhythm   ED Course: Patient is a 64 year old female who presents to the ER for evaluation of worsening shortness of breath.  She tested positive for the COVID-19 virus on 12/31/19 but has had symptoms for over a week.  Chest x-ray shows patchy bilateral airspace disease compatible with pneumonia.  On room air at rest she has a pulse oximetry of 95% but is tachypneic with respiratory rate between 24 and 30.  She is currently on 2 L of oxygen.  She will be admitted to the hospital for further evaluation.   Assessment & Plan:   Principal Problem:   Pneumonia due to COVID-19 virus Active Problems:   Hyponatremia   Hypokalemia  1 pneumonia secondary to COVID-19 virus Patient had  presented with worsening shortness of breath with associated pleuritic chest pain with a nonproductive cough.  Patient noted to have received 2 doses of COVID-19 vaccine in April.  Patient noted to have tested positive for COVID-19 virus 12/31/2019 however per RN unable to find proof.  Repeat COVID-19 PCR ordered which came back positive this morning.  Chest x-ray consistent with Covid pneumonia findings.  Will discontinue IV Decadron.  Placed on IV Solu-Medrol 60 mg every 12 hours.  Continue IV remdesivir.  Patient noted to have had a elevated CRP on admission as such we will start patient on baricitinib.  Placed on Flonase, Mucinex, Combivent, Claritin, PPI.  Continue zinc and vitamin C.  Follow.  2.  Hyponatremia Improving with hydration.   3.  Hypokalemia Repleted.  Magnesium at 2.1.  Phosphorus at 2.7.   DVT prophylaxis: Lovenox Code Status: Full Family Communication: Updated patient.  No family at bedside. Disposition:   Status is: Inpatient    Dispo: The patient is from: Home              Anticipated d/c is to: Home              Anticipated d/c date is: To be determined              Patient currently being treated for COVID-19 pneumonia infection, with elevated inflammatory markers, on IV remdesivir.  Baricitinib started today.  Not stable for discharge.       Consultants:   None  Procedures:  Chest x-ray 01/03/2020>>>>  Antimicrobials:   Baricitinib 01/05/2020>>> 01/19/2020  IV remdesivir 01/04/2020>>>> 01/09/2020   Subjective: Patient sitting up in bed.  States some improvement with her shortness of breath since admission.  Some improvement with pleuritic chest pain and cough.  No abdominal pain.  iPad interpreter used.  Objective: Vitals:   01/05/20 0500 01/05/20 0515 01/05/20 0645 01/05/20 1056  BP: 107/77  130/82 133/82  Pulse: 67 66 73 80  Resp: (!) 34 (!) 26 20 17   Temp:   97.8 F (36.6 C) 97.6 F (36.4 C)  TempSrc:    Oral  SpO2: 97% 98% 96% 100%    Weight:      Height:       No intake or output data in the 24 hours ending 01/05/20 1233 Filed Weights   01/03/20 2027  Weight: 108.4 kg    Examination:  General exam: NAD Respiratory system: Some decreased breath sounds in the bases.  No wheezing.  No crackles.  Speaking in full sentences.  Normal respiratory effort.  Cardiovascular system: S1 & S2 heard, RRR. No JVD, murmurs, rubs, gallops or clicks. No pedal edema. Gastrointestinal system: Abdomen is nondistended, soft and nontender. No organomegaly or masses felt. Normal bowel sounds heard. Central nervous system: Alert and oriented. No focal neurological deficits. Extremities: Symmetric 5 x 5 power. Skin: No rashes, lesions or ulcers Psychiatry: Judgement and insight appear normal. Mood & affect appropriate.     Data Reviewed: I have personally reviewed following labs and imaging studies  CBC: Recent Labs  Lab 01/03/20 2043 01/05/20 0742  WBC 8.7 6.4  NEUTROABS 7.8* 5.3  HGB 12.3 12.3  HCT 37.0 35.2*  MCV 79.7* 78.0*  PLT 234 294    Basic Metabolic Panel: Recent Labs  Lab 01/03/20 2043 01/05/20 0742  NA 127* 133*  K 3.4* 4.2  CL 93* 100  CO2 22 23  GLUCOSE 139* 195*  BUN 10 22  CREATININE 0.63 0.58  CALCIUM 8.5* 8.6*  MG  --  2.1  PHOS  --  2.7    GFR: Estimated Creatinine Clearance: 84.5 mL/min (by C-G formula based on SCr of 0.58 mg/dL).  Liver Function Tests: Recent Labs  Lab 01/03/20 2043 01/05/20 0742  AST 35 40  ALT 23 26  ALKPHOS 63 59  BILITOT 0.6 0.5  PROT 7.5 7.4  ALBUMIN 3.1* 2.8*    CBG: No results for input(s): GLUCAP in the last 168 hours.   Recent Results (from the past 240 hour(s))  SARS Coronavirus 2 by RT PCR (hospital order, performed in [facility] hospital lab) Nasopharyngeal Nasopharyngeal Swab     Status: Abnormal   Collection Time: 01/05/20  8:33 AM   Specimen: Nasopharyngeal Swab  Result Value Ref Range Status   SARS Coronavirus 2 POSITIVE (A) NEGATIVE  Final    Comment: RESULT CALLED TO, READ BACK BY AND VERIFIED WITH: CHRIS BENNETT AT 1004 ON 01/05/2020 [facility]. (NOTE) SARS-CoV-2 target nucleic acids are DETECTED  SARS-CoV-2 RNA is generally detectable in upper respiratory specimens  during the acute phase of infection.  Positive results are indicative  of the presence of the identified virus, but do not rule out bacterial infection or co-infection with other pathogens not detected by the test.  Clinical correlation with patient history and  other diagnostic information is necessary to determine patient infection status.  The expected result is negative.  Fact Sheet for Patients:   03/06/2020   Fact Sheet for Healthcare Providers:   BoilerBrush.com.cy    This test is  not yet approved or cleared by the [place] and  has been authorized for detection and/or diagnosis of SARS-CoV-2 by FDA under an Emergency Use Authorization (EUA).  This EUA will remain in effect (meaning thi s test can be used) for the duration of  the COVID-19 declaration under Section 564(b)(1) of the Act, 21 U.S.C. section 360-bbb-3(b)(1), unless the authorization is terminated or revoked sooner.  Performed at [facility], [address] [place], [place] [address]          Radiology Studies: DG Chest 2 View  Result Date: 01/03/2020 CLINICAL DATA:  Shortness of breath EXAM: CHEST - 2 VIEW COMPARISON:  01/02/2016 FINDINGS: Patchy bilateral airspace disease. Low lung volumes. Heart is normal size. No effusions or acute bony abnormality. IMPRESSION: Extensive patchy bilateral airspace disease compatible with pneumonia. COVID pneumonia could have this appearance. Electronically Signed   By: Charlett Nose M.D.   On: 01/03/2020 21:22        Scheduled Meds:  vitamin C  500 mg Oral Daily   baricitinib  4 mg Oral Daily   enoxaparin (LOVENOX) injection  55 mg Subcutaneous Q24H    fluticasone  2 spray Each Nare Daily   guaiFENesin  1,200 mg Oral BID   Ipratropium-Albuterol  1 puff Inhalation Q6H   loratadine  10 mg Oral Daily   methylPREDNISolone (SOLU-MEDROL) injection  60 mg Intravenous Q12H   pantoprazole  40 mg Oral Q0600   sodium chloride flush  3 mL Intravenous Q12H   zinc sulfate  220 mg Oral Daily   Continuous Infusions:  sodium chloride 250 mL (01/05/20 0920)   remdesivir 100 mg in NS 100 mL 100 mg (01/05/20 0921)     LOS: 1 day    Time spent: 40 minutes    Ramiro Harvest, MD [facility]   To contact the attending provider between 7A-7P or the covering provider during after hours 7P-7A, please log into the web site [email or web address] and access using universal [place] password for that web site. If you do not have the password, please call the hospital operator.  01/05/2020, 12:33 PM

## 2020-01-05 NOTE — ED Notes (Signed)
Pt sleeping. 

## 2020-01-06 LAB — COMPREHENSIVE METABOLIC PANEL
ALT: 24 U/L (ref 0–44)
AST: 30 U/L (ref 15–41)
Albumin: 2.9 g/dL — ABNORMAL LOW (ref 3.5–5.0)
Alkaline Phosphatase: 54 U/L (ref 38–126)
Anion gap: 11 (ref 5–15)
BUN: 20 mg/dL (ref 8–23)
CO2: 21 mmol/L — ABNORMAL LOW (ref 22–32)
Calcium: 8.7 mg/dL — ABNORMAL LOW (ref 8.9–10.3)
Chloride: 99 mmol/L (ref 98–111)
Creatinine, Ser: 0.7 mg/dL (ref 0.44–1.00)
GFR calc Af Amer: >60 mL/min (ref >60)
GFR calc non Af Amer: >60 mL/min (ref >60)
Glucose, Bld: 167 mg/dL — ABNORMAL HIGH (ref 70–99)
Potassium: 4.5 mmol/L (ref 3.5–5.1)
Sodium: 131 mmol/L — ABNORMAL LOW (ref 135–145)
Total Bilirubin: 0.5 mg/dL (ref 0.3–1.2)
Total Protein: 7.3 g/dL (ref 6.5–8.1)

## 2020-01-06 LAB — CBC WITH DIFFERENTIAL/PLATELET
Abs Immature Granulocytes: 0.45 10*3/uL — ABNORMAL HIGH (ref 0.00–0.07)
Basophils Absolute: 0 10*3/uL (ref 0.0–0.1)
Basophils Relative: 0 %
Eosinophils Absolute: 0 10*3/uL (ref 0.0–0.5)
Eosinophils Relative: 0 %
HCT: 36.2 % (ref 36.0–46.0)
Hemoglobin: 12.3 g/dL (ref 12.0–15.0)
Immature Granulocytes: 4 %
Lymphocytes Relative: 12 %
Lymphs Abs: 1.3 10*3/uL (ref 0.7–4.0)
MCH: 26.6 pg (ref 26.0–34.0)
MCHC: 34 g/dL (ref 30.0–36.0)
MCV: 78.4 fL — ABNORMAL LOW (ref 80.0–100.0)
Monocytes Absolute: 0.8 10*3/uL (ref 0.1–1.0)
Monocytes Relative: 7 %
Neutro Abs: 8.5 10*3/uL — ABNORMAL HIGH (ref 1.7–7.7)
Neutrophils Relative %: 77 %
Platelets: 366 10*3/uL (ref 150–400)
RBC: 4.62 MIL/uL (ref 3.87–5.11)
RDW: 15.9 % — ABNORMAL HIGH (ref 11.5–15.5)
WBC: 11 10*3/uL — ABNORMAL HIGH (ref 4.0–10.5)
nRBC: 0 % (ref 0.0–0.2)

## 2020-01-06 LAB — FERRITIN: Ferritin: 128 ng/mL (ref 11–307)

## 2020-01-06 LAB — C-REACTIVE PROTEIN: CRP: 4.8 mg/dL — ABNORMAL HIGH (ref <1.0)

## 2020-01-06 LAB — PHOSPHORUS: Phosphorus: 2.8 mg/dL (ref 2.5–4.6)

## 2020-01-06 LAB — MAGNESIUM: Magnesium: 2.1 mg/dL (ref 1.7–2.4)

## 2020-01-06 LAB — FIBRIN DERIVATIVES D-DIMER (ARMC ONLY): Fibrin derivatives D-dimer (ARMC): 629.29 ng/mL (FEU) — ABNORMAL HIGH (ref 0.00–499.00)

## 2020-01-06 NOTE — Progress Notes (Signed)
PROGRESS NOTE    Dorothy Patton  BWG:[id number] DOB: 28-Dec-1955 DOA: 01/04/2020 PCP: [facility], [facility]    Chief Complaint  Patient presents with  . Shortness of Breath    Brief Narrative:  HPI per Dr. Andre Lefort is a 64 y.o. female with medical history significant for hypertension, arthritis and migraine headaches who presented to the ED for evaluation of shortness of breath.  Patient reported that she started having symptoms of cough and malaise about a week and half ago but tested positive for the COVID-19 virus on 12/31/19.  She complains of shortness of breath that has progressively worsened to the point that she is now short of breath at rest. Shortness of breath is associated with pleuritic chest pain and a non productive cough. She denies having fever, no nausea, no vomiting, no diarrhea or abdominal pain. Patient received 2 doses of the COVID 19 vaccine in April, 2021. Labs show sodium 127, potassium, bicarb 22, glucose 139, BUN 10, creatinine 0.63, calcium 8.5, AST 35, ALT 23, WBC 8.7, hemoglobin 12.3, hematocrit 37, platelet count 234 Chest x-ray reviewed by me shows extensive patchy bilateral airspace disease compatible with pneumonia. Twelve-lead EKG shows normal sinus rhythm   ED Course: Patient is a 64 year old female who presents to the ER for evaluation of worsening shortness of breath.  She tested positive for the COVID-19 virus on 12/31/19 but has had symptoms for over a week.  Chest x-ray shows patchy bilateral airspace disease compatible with pneumonia.  On room air at rest she has a pulse oximetry of 95% but is tachypneic with respiratory rate between 24 and 30.  She is currently on 2 L of oxygen.  She will be admitted to the hospital for further evaluation.   Assessment & Plan:   Principal Problem:   Pneumonia due to COVID-19 virus Active Problems:   Hyponatremia   Hypokalemia  1 pneumonia secondary to COVID-19 virus Patient had  presented with worsening shortness of breath with associated pleuritic chest pain with a nonproductive cough.  Patient noted to have received 2 doses of COVID-19 vaccine in April.  Patient noted to have tested positive for COVID-19 virus 12/31/2019 however per RN unable to find proof.  Repeat COVID-19 PCR ordered positive. Chest x-ray consistent with Covid pneumonia findings.  Continue IV Solu-Medrol 60 mg every 12 hours, IV remdesivir.  Baricitinib started as patient noted to have an elevated CRP of 11.2 on admission.  CRP currently on 4.8.  Continue Flonase, Mucinex, Combivent, Claritin, PPI, zinc, vitamin C.  Supportive care.   2.  Hyponatremia Improving with hydration.   3.  Hypokalemia Repleted.  Potassium at 4.5.  Magnesium at 2.1.  Phosphorus at 2.8.   DVT prophylaxis: Lovenox Code Status: Full Family Communication: Updated patient.  No family at bedside. Disposition:   Status is: Inpatient    Dispo: The patient is from: Home              Anticipated d/c is to: Home              Anticipated d/c date is: 2 to 3 days.              Patient currently being treated for COVID-19 pneumonia infection, with elevated inflammatory markers which are trending down, on IV remdesivir, baricitinib.  Not stable for discharge.       Consultants:   None  Procedures:  Chest x-ray 01/03/2020>>>>    Antimicrobials:   Baricitinib 01/05/2020>>> 01/19/2020  IV remdesivir 01/04/2020>>>> 01/09/2020   Subjective: Patient sitting up in chair.  Few shortness of breath has improved.  Feels improvement with pleuritic chest pain.  No abdominal pain.  Currently has been weaned down to room air with sats of -93-96%. iPad interpreter used.  Objective: Vitals:   01/05/20 2051 01/06/20 0409 01/06/20 0805 01/06/20 1209  BP: 131/80 138/80 119/84 134/81  Pulse: 84 67 78 81  Resp: 18 18 18 18   Temp: 99 F (37.2 C) 97.6 F (36.4 C) 98.7 F (37.1 C) 98.1 F (36.7 C)  TempSrc: Oral Oral Oral   SpO2: 95%  99% 98% 96%  Weight:      Height:        Intake/Output Summary (Last 24 hours) at 01/06/2020 1307 Last data filed at 01/06/2020 0905 Gross per 24 hour  Intake 358.83 ml  Output --  Net 358.83 ml   Filed Weights   01/03/20 2027  Weight: 108.4 kg    Examination:  General exam: NAD Respiratory system: Decreased breath sounds in the bases.  Fair air movement.  No wheezes, no crackles.  Speaking in full sentences.  Normal respiratory effort.  Cardiovascular system: Regular rate and rhythm no murmurs rubs or gallops.  No JVD noted, no murmurs rubs or gallops.  No lower extremity edema.  Gastrointestinal system: Abdomen is soft, nontender, nondistended, positive bowel sounds.  No rebound.  No guarding.  Central nervous system: Alert and oriented. No focal neurological deficits. Extremities: Symmetric 5 x 5 power. Skin: No rashes, lesions or ulcers Psychiatry: Judgement and insight appear normal. Mood & affect appropriate.     Data Reviewed: I have personally reviewed following labs and imaging studies  CBC: Recent Labs  Lab 01/03/20 2043 01/05/20 0742 01/06/20 0637  WBC 8.7 6.4 11.0*  NEUTROABS 7.8* 5.3 8.5*  HGB 12.3 12.3 12.3  HCT 37.0 35.2* 36.2  MCV 79.7* 78.0* 78.4*  PLT 234 294 366    Basic Metabolic Panel: Recent Labs  Lab 01/03/20 2043 01/05/20 0742 01/06/20 0637  NA 127* 133* 131*  K 3.4* 4.2 4.5  CL 93* 100 99  CO2 22 23 21*  GLUCOSE 139* 195* 167*  BUN 10 22 20   CREATININE 0.63 0.58 0.70  CALCIUM 8.5* 8.6* 8.7*  MG  --  2.1 2.1  PHOS  --  2.7 2.8    GFR: Estimated Creatinine Clearance: 84.5 mL/min (by C-G formula based on SCr of 0.7 mg/dL).  Liver Function Tests: Recent Labs  Lab 01/03/20 2043 01/05/20 0742 01/06/20 0637  AST 35 40 30  ALT 23 26 24   ALKPHOS 63 59 54  BILITOT 0.6 0.5 0.5  PROT 7.5 7.4 7.3  ALBUMIN 3.1* 2.8* 2.9*    CBG: No results for input(s): GLUCAP in the last 168 hours.   Recent Results (from the past 240  hour(s))  SARS Coronavirus 2 by RT PCR (hospital order, performed in [facility] hospital lab) Nasopharyngeal Nasopharyngeal Swab     Status: Abnormal   Collection Time: 01/05/20  8:33 AM   Specimen: Nasopharyngeal Swab  Result Value Ref Range Status   SARS Coronavirus 2 POSITIVE (A) NEGATIVE Final    Comment: RESULT CALLED TO, READ BACK BY AND VERIFIED WITH: CHRIS BENNETT AT 1004 ON 01/05/2020 [facility]. (NOTE) SARS-CoV-2 target nucleic acids are DETECTED  SARS-CoV-2 RNA is generally detectable in upper respiratory specimens  during the acute phase of infection.  Positive results are indicative  of the presence of the identified virus, but do not rule  out bacterial infection or co-infection with other pathogens not detected by the test.  Clinical correlation with patient history and  other diagnostic information is necessary to determine patient infection status.  The expected result is negative.  Fact Sheet for Patients:   BoilerBrush.com.cy   Fact Sheet for Healthcare Providers:   https://pope.com/    This test is not yet approved or cleared by the [place] FDA and  has been authorized for detection and/or diagnosis of SARS-CoV-2 by FDA under an Emergency Use Authorization (EUA).  This EUA will remain in effect (meaning thi s test can be used) for the duration of  the COVID-19 declaration under Section 564(b)(1) of the Act, 21 U.S.C. section 360-bbb-3(b)(1), unless the authorization is terminated or revoked sooner.  Performed at [facility], [address], [place], [place] [address]          Radiology Studies: No results found.      Scheduled Meds: . vitamin C  500 mg Oral Daily  . baricitinib  4 mg Oral Daily  . enoxaparin (LOVENOX) injection  55 mg Subcutaneous Q24H  . fluticasone  2 spray Each Nare Daily  . guaiFENesin  1,200 mg Oral BID  . Ipratropium-Albuterol  1 puff Inhalation Q6H  . loratadine   10 mg Oral Daily  . methylPREDNISolone (SOLU-MEDROL) injection  60 mg Intravenous Q12H  . pantoprazole  40 mg Oral Q0600  . sodium chloride flush  3 mL Intravenous Q12H  . zinc sulfate  220 mg Oral Daily   Continuous Infusions: . sodium chloride Stopped (01/05/20 1017)  . remdesivir 100 mg in NS 100 mL 100 mg (01/06/20 0911)     LOS: 2 days    Time spent: 40 minutes    Ramiro Harvest, MD [facility]   To contact the attending provider between 7A-7P or the covering provider during after hours 7P-7A, please log into the web site [email or web address] and access using universal [place] password for that web site. If you do not have the password, please call the hospital operator.  01/06/2020, 1:07 PM

## 2020-01-06 NOTE — Progress Notes (Signed)
Patient resting in bedside chair. Has no pain or distress. Ambulates to bathroom independently with O2 extension, oxygen at 2L [place]. Will begin to slowly titrate that down. She has call bell in reach.

## 2020-01-06 NOTE — Progress Notes (Addendum)
Patient is tolerating being on room air. O2 sats are maintaining in the upper 90s. She does not complain of any distress. Bed in low position and call bell in reach.  Called person to notify and updated on patient status.  1633- currently sitting in bedside with all items within reach. RA with no SOB. She has no pain or distress. Ambulates independently.

## 2020-01-07 LAB — C-REACTIVE PROTEIN: CRP: 1.9 mg/dL — ABNORMAL HIGH (ref <1.0)

## 2020-01-07 LAB — CBC WITH DIFFERENTIAL/PLATELET
Abs Immature Granulocytes: 0.87 10*3/uL — ABNORMAL HIGH (ref 0.00–0.07)
Basophils Absolute: 0 10*3/uL (ref 0.0–0.1)
Basophils Relative: 0 %
Eosinophils Absolute: 0 10*3/uL (ref 0.0–0.5)
Eosinophils Relative: 0 %
HCT: 37.2 % (ref 36.0–46.0)
Hemoglobin: 12.6 g/dL (ref 12.0–15.0)
Immature Granulocytes: 7 %
Lymphocytes Relative: 11 %
Lymphs Abs: 1.3 10*3/uL (ref 0.7–4.0)
MCH: 27 pg (ref 26.0–34.0)
MCHC: 33.9 g/dL (ref 30.0–36.0)
MCV: 79.7 fL — ABNORMAL LOW (ref 80.0–100.0)
Monocytes Absolute: 1 10*3/uL (ref 0.1–1.0)
Monocytes Relative: 8 %
Neutro Abs: 8.9 10*3/uL — ABNORMAL HIGH (ref 1.7–7.7)
Neutrophils Relative %: 74 %
Platelets: 383 10*3/uL (ref 150–400)
RBC: 4.67 MIL/uL (ref 3.87–5.11)
RDW: 15.9 % — ABNORMAL HIGH (ref 11.5–15.5)
Smear Review: NORMAL
WBC: 12.1 10*3/uL — ABNORMAL HIGH (ref 4.0–10.5)
nRBC: 0 % (ref 0.0–0.2)

## 2020-01-07 LAB — MAGNESIUM: Magnesium: 2 mg/dL (ref 1.7–2.4)

## 2020-01-07 LAB — COMPREHENSIVE METABOLIC PANEL
ALT: 27 U/L (ref 0–44)
AST: 27 U/L (ref 15–41)
Albumin: 2.9 g/dL — ABNORMAL LOW (ref 3.5–5.0)
Alkaline Phosphatase: 59 U/L (ref 38–126)
Anion gap: 10 (ref 5–15)
BUN: 19 mg/dL (ref 8–23)
CO2: 22 mmol/L (ref 22–32)
Calcium: 8.9 mg/dL (ref 8.9–10.3)
Chloride: 99 mmol/L (ref 98–111)
Creatinine, Ser: 0.71 mg/dL (ref 0.44–1.00)
GFR calc Af Amer: >60 mL/min (ref >60)
GFR calc non Af Amer: >60 mL/min (ref >60)
Glucose, Bld: 184 mg/dL — ABNORMAL HIGH (ref 70–99)
Potassium: 4.9 mmol/L (ref 3.5–5.1)
Sodium: 131 mmol/L — ABNORMAL LOW (ref 135–145)
Total Bilirubin: 0.6 mg/dL (ref 0.3–1.2)
Total Protein: 7.2 g/dL (ref 6.5–8.1)

## 2020-01-07 LAB — FIBRIN DERIVATIVES D-DIMER (ARMC ONLY): Fibrin derivatives D-dimer (ARMC): 470.37 ng/mL (FEU) (ref 0.00–499.00)

## 2020-01-07 LAB — PHOSPHORUS: Phosphorus: 3.1 mg/dL (ref 2.5–4.6)

## 2020-01-07 LAB — FERRITIN: Ferritin: 129 ng/mL (ref 11–307)

## 2020-01-07 NOTE — Progress Notes (Signed)
Occupational Therapy Treatment Patient Details Name: Dorothy Patton MRN: [id number] DOB: 13-Apr-1956 Today's Date: 01/07/2020    History of present illness Dorothy Patton is a 64 yo female who comes to [facility] with SOB, admitted with COVID19.   OT comments  Dorothy Patton was seen for OT treatment on this date. Upon arrival to room pt awake seated in chair, agreeable to tx using iPad translator. Pt requires MOD I for ADL t/fs (increased time) and ~20 ft in room mobility, self-initiating seated rest break. Pt reports using IS every hour and expresses interest in further HEP. Pt engaged in BUE THEREX: 1 rep x 5 sets each - chair push ups, overhead press, IS. Pt verbalized understanding of instruction provided- expressing appreciation for HEP. Pt making good progress toward goals. Pt continues to benefit from skilled OT services to maximize return to PLOF and minimize risk of future falls, injury, caregiver burden, and readmission. Will continue to follow POC. Discharge recommendation remains appropriate.    Follow Up Recommendations  No OT follow up    Equipment Recommendations  None recommended by OT    Recommendations for Other Services      Precautions / Restrictions Precautions Precautions: None Restrictions Weight Bearing Restrictions: No       Mobility Bed Mobility      General bed mobility comments: Pt received and left up in chair  Transfers Overall transfer level: Independent Equipment used: None    General transfer comment: Increased time, moves slowly     Balance Overall balance assessment: No apparent balance deficits (not formally assessed)          ADL either performed or assessed with clinical judgement   ADL Overall ADL's : Needs assistance/impaired      General ADL Comments: MOD I for ADL t/fs (increased time). Decreased activity tolerance for standing ADLs.       Cognition Arousal/Alertness: Awake/alert Behavior During Therapy: WFL for tasks  assessed/performed Overall Cognitive Status: Within Functional Limits for tasks assessed          Exercises Exercises: General Upper Extremity General Exercises - Upper Extremity Shoulder Flexion: AROM;Both;5 reps;Seated Elbow Flexion: AROM;Both;5 reps;Seated Chair Push Up: Strengthening;Both;5 reps;Seated Other Exercises Other Exercises: Pt educated re: ECS, HEP (overhead press, chaor pushups, IS) Other Exercises: 1 set x 5 reps each of HEP, in room ~20 ft ambulation, sitting.standing balance/tolerance   Shoulder Instructions       General Comments V/S stable on 1L [place]    Pertinent Vitals/ Pain       Pain Assessment: No/denies pain   Frequency  Min 1X/week        Progress Toward Goals  OT Goals(current goals can now be found in the care plan section)  Progress towards OT goals: Progressing toward goals  Acute Rehab OT Goals Patient Stated Goal: be able to breathe more easily OT Goal Formulation: With patient Time For Goal Achievement: 01/19/20 Potential to Achieve Goals: Good ADL Goals Pt Will Transfer to Toilet: with modified independence;ambulating;regular height toilet;grab bars Pt Will Perform Toileting - Clothing Manipulation and hygiene: with modified independence;sit to/from stand Pt/caregiver will Perform Home Exercise Program: Increased strength;Both right and left upper extremity;With Supervision Additional ADL Goal #1: Pt will demo implementation of 1-3 EC strategies during daily activities with 0% cues.  Plan Discharge plan remains appropriate;Frequency remains appropriate    AM-PAC OT "6 Clicks" Daily Activity     Outcome Measure   Help from another person eating meals?: None Help from another person taking  care of personal grooming?: None Help from another person toileting, which includes using toliet, bedpan, or urinal?: A Little Help from another person bathing (including washing, rinsing, drying)?: A Little Help from another person to put on and  taking off regular upper body clothing?: None Help from another person to put on and taking off regular lower body clothing?: A Little 6 Click Score: 21    End of Session    OT Visit Diagnosis: Muscle weakness (generalized) (M62.81)   Activity Tolerance Patient tolerated treatment well   Patient Left in chair;with call bell/phone within reach   Nurse Communication          Time: 1450-1520 OT Time Calculation (min): 30 min  Charges: OT General Charges $OT Visit: 1 Visit OT Treatments $Therapeutic Activity: 8-22 mins $Therapeutic Exercise: 8-22 mins  Kathie Dike, M.S. OTR/L  01/07/20, 4:50 PM  ascom [phone]

## 2020-01-07 NOTE — Progress Notes (Signed)
Patient is sitting in bedside chair with no complaints. She did have to place her O2 1L back on due to sats dropping into 80s, after she got back in room from walk test. Her oxygen is now in the 90s. She has no distress. Call bell in reach.

## 2020-01-07 NOTE — Care Management Important Message (Signed)
Important Message  Patient Details  Name: Dorothy Patton MRN: [id number] Date of Birth: [birth year]   Medicare Important Message Given:  Yes  Initial Medicare IM given by Patient Access Associate on 01/06/2020 at 12:14pm.     Johnell Comings 01/07/2020, 11:26 AM

## 2020-01-07 NOTE — Progress Notes (Signed)
Physical Therapy Treatment Patient Details Name: Dorothy Patton MRN: [id number] DOB: [birth year] Today's Date: 01/07/2020    History of Present Illness Nesa Distel is a 64 yo female who comes to [facility] with SOB, admitted with COVID19.    PT Comments    Pt sitting in recliner upon PT arrival.  Nurse reports pt has been ambulating independently in room.  Pt on 1 L O2 but placed on room air for ambulation trial (per discussion with pt's nurse).  Pt's HR increased from 84 to 122 bpm with ambulation but O2 sats 92% or greater on room air during ambulation of 120 feet; ambulation distance limited d/t SOB.  After pt sat down in recliner to rest, pt's O2 sats then decreased to 83% on room air and pt still SOB (pt placed back on 1 L O2 to improve O2 sats--pt's O2 92% at rest end of session on 1 L O2 via nasal cannula and SOB resolved with pt resting in recliner--nurse notified).  Will continue to progress pt with strengthening and increasing ambulation distance per pt tolerance.   Follow Up Recommendations  No PT follow up     Equipment Recommendations  None recommended by PT    Recommendations for Other Services       Precautions / Restrictions Precautions Precautions: None Restrictions Weight Bearing Restrictions: No    Mobility  Bed Mobility               General bed mobility comments: Deferred (pt sitting in recliner beginning and end of session)  Transfers Overall transfer level: Independent Equipment used: None             General transfer comment: x1 trial standing from recliner and x1 trial standing from chair; no difficulties noted  Ambulation/Gait Ambulation/Gait assistance: Supervision Gait Distance (Feet): 120 Feet Assistive device: None   Gait velocity: decreased   General Gait Details: partial step through gait pattern; steady; limited distance ambulating d/t SOB   Stairs             Wheelchair Mobility    Modified Rankin (Stroke Patients  Only)       Balance Overall balance assessment: No apparent balance deficits (not formally assessed) (No loss of balance with ambulation)                                          Cognition Arousal/Alertness: Awake/alert Behavior During Therapy: WFL for tasks assessed/performed Overall Cognitive Status: Within Functional Limits for tasks assessed                                        Exercises      General Comments   Nursing cleared pt for participation in physical therapy.  Pt agreeable to PT session.  Therapist brought in ipad to use for spanish interpreter but pt declining use of it (pt declined use of interpreter) during session (therapist checked with pt multiple times during session to make sure pt felt comfortable and pt declined use of interpreter each time); pt reporting she knew some English and was able to appropriately understand and communicate with therapist during session.      Pertinent Vitals/Pain Pain Assessment: No/denies pain    Home Living  Prior Function            PT Goals (current goals can now be found in the care plan section) Acute Rehab PT Goals Patient Stated Goal: be able to breathe more easily PT Goal Formulation: With patient Time For Goal Achievement: 01/19/20 Potential to Achieve Goals: Good Progress towards PT goals: Progressing toward goals    Frequency    Min 2X/week      PT Plan Current plan remains appropriate    Co-evaluation              AM-PAC PT "6 Clicks" Mobility   Outcome Measure  Help needed turning from your back to your side while in a flat bed without using bedrails?: None Help needed moving from lying on your back to sitting on the side of a flat bed without using bedrails?: None Help needed moving to and from a bed to a chair (including a wheelchair)?: None Help needed standing up from a chair using your arms (e.g., wheelchair or bedside  chair)?: None Help needed to walk in hospital room?: None Help needed climbing 3-5 steps with a railing? : A Little 6 Click Score: 23    End of Session Equipment Utilized During Treatment: Oxygen Activity Tolerance: Other (comment) (Limited ambulation d/t SOB and O2 desaturation) Patient left: in chair;with call bell/phone within reach Nurse Communication: Mobility status;Precautions;Other (comment) (Pt's O2 sats and HR during sessions activities) PT Visit Diagnosis: Difficulty in walking, not elsewhere classified (R26.2);Other abnormalities of gait and mobility (R26.89)     Time: 6144-3154 PT Time Calculation (min) (ACUTE ONLY): 23 min  Charges:  $Therapeutic Exercise: 8-22 mins $Therapeutic Activity: 8-22 mins                     Hendricks Limes, PT 01/07/20, 2:00 PM

## 2020-01-07 NOTE — Progress Notes (Signed)
PROGRESS NOTE    Dorothy Patton  CXK:[id number] DOB: May 22, 1955 DOA: 01/04/2020 PCP: [facility]  Brief Narrative:  HPI per Dr. Imagene Sheller a 64 y.o.femalewith medical history significant forhypertension, arthritis and migraine headaches who presented to the ED for evaluation of shortness of breath. Patient reported that she started having symptoms of cough and malaise about a week and half ago but tested positive for the COVID-19 virus on 12/31/19.She complains of shortness of breath that has progressively worsened to the point that she is now short of breath at rest. Shortness of breath is associated with pleuritic chest pain and a non productive cough. She denies having fever, no nausea, no vomiting, no diarrhea or abdominal pain. Patient received 2 doses of the COVID 19 vaccine in April, 2021. Labsshow sodium 127, potassium, bicarb 22, glucose 139, BUN 10, creatinine 0.63,calcium 8.5, AST 35, ALT 23, WBC 8.7,hemoglobin 12.3, hematocrit 37,platelet count 234 Chest x-ray reviewed by me shows extensive patchy bilateral airspace disease compatible with pneumonia.  9/8: Patient seen and examined.  Titrated to room air however upon interview endorses worsening shortness of breath over interval.  Does better on 1 to 2 L nasal cannula.  Dyspnea improves.   Assessment & Plan:   Principal Problem:   Pneumonia due to COVID-19 virus Active Problems:   Hyponatremia   Hypokalemia  Pneumonia secondary to COVID-19 Mild hypoxia secondary to above Patient states that she received 2 doses of COVID-19 vaccine in April Chest x-ray consistent with bilateral infiltrates Appears to be improving over interval Plan: Continue IV remdesivir, last dose tomorrow Continue steroids Continue baricitinib Stressed I-S and flutter use Prone as tolerated  Hyponatremia Improved No need for IV hydration  Hypokalemia Monitor and replace as necessary   DVT  prophylaxis: Lovenox Code Status: Full Family Communication: Daughter Jataya Wann [phone] on 01/07/2020 Disposition Plan: Status is: Inpatient  Remains inpatient appropriate because:Inpatient level of care appropriate due to severity of illness   Dispo: The patient is from: Home              Anticipated d/c is to: Home              Anticipated d/c date is: 1 day              Patient currently is not medically stable to d/c.  Patient still symptomatic though objectively improving.  Anticipate 1 additional day of inpatient monitoring and treatment.  Plan for patient to complete last dose of remdesivir in house tomorrow and discharge home afterward       Consultants:   none  Procedures:   none  Antimicrobials:   Remdesivir   Subjective: Seen and examined.  On room air to 1 L.  Endorsing worsening shortness of breath between yesterday and today  Objective: Vitals:   01/07/20 0444 01/07/20 0632 01/07/20 0730 01/07/20 1158  BP: 131/78  (!) 148/94 (!) 148/90  Pulse: 70  77 78  Resp: 20  (!) 24 18  Temp: 98.5 F (36.9 C)  98.3 F (36.8 C) 98.6 F (37 C)  TempSrc: Oral  Oral Oral  SpO2: 100%  100% 92%  Weight:  105.6 kg    Height:        Intake/Output Summary (Last 24 hours) at 01/07/2020 1402 Last data filed at 01/07/2020 1001 Gross per 24 hour  Intake --  Output 2035 ml  Net -2035 ml   Filed Weights   01/03/20 2027 01/06/20 1500 01/07/20 8502  Weight: 108.4 kg 106.2 kg 105.6 kg    Examination:  General exam: Appears calm and comfortable  Respiratory system: Mild bibasilar crackles.  Normal work of breathing. Cardiovascular system: S1 & S2 heard, RRR. No JVD, murmurs, rubs, gallops or clicks. No pedal edema. Gastrointestinal system: Abdomen is nondistended, soft and nontender. No organomegaly or masses felt. Normal bowel sounds heard. Central nervous system: Alert and oriented. No focal neurological deficits. Extremities: Symmetric 5 x 5 power. Skin:  No rashes, lesions or ulcers Psychiatry: Judgement and insight appear normal. Mood & affect appropriate.     Data Reviewed: I have personally reviewed following labs and imaging studies  CBC: Recent Labs  Lab 01/03/20 2043 01/05/20 0742 01/06/20 0637 01/07/20 0544  WBC 8.7 6.4 11.0* 12.1*  NEUTROABS 7.8* 5.3 8.5* 8.9*  HGB 12.3 12.3 12.3 12.6  HCT 37.0 35.2* 36.2 37.2  MCV 79.7* 78.0* 78.4* 79.7*  PLT 234 294 366 383   Basic Metabolic Panel: Recent Labs  Lab 01/03/20 2043 01/05/20 0742 01/06/20 0637 01/07/20 0544  NA 127* 133* 131* 131*  K 3.4* 4.2 4.5 4.9  CL 93* 100 99 99  CO2 22 23 21* 22  GLUCOSE 139* 195* 167* 184*  BUN 10 22 20 19   CREATININE 0.63 0.58 0.70 0.71  CALCIUM 8.5* 8.6* 8.7* 8.9  MG  --  2.1 2.1 2.0  PHOS  --  2.7 2.8 3.1   GFR: Estimated Creatinine Clearance: 83.2 mL/min (by C-G formula based on SCr of 0.71 mg/dL). Liver Function Tests: Recent Labs  Lab 01/03/20 2043 01/05/20 0742 01/06/20 0637 01/07/20 0544  AST 35 40 30 27  ALT 23 26 24 27   ALKPHOS 63 59 54 59  BILITOT 0.6 0.5 0.5 0.6  PROT 7.5 7.4 7.3 7.2  ALBUMIN 3.1* 2.8* 2.9* 2.9*   No results for input(s): LIPASE, AMYLASE in the last 168 hours. No results for input(s): AMMONIA in the last 168 hours. Coagulation Profile: No results for input(s): INR, PROTIME in the last 168 hours. Cardiac Enzymes: No results for input(s): CKTOTAL, CKMB, CKMBINDEX, TROPONINI in the last 168 hours. BNP (last 3 results) No results for input(s): PROBNP in the last 8760 hours. HbA1C: No results for input(s): HGBA1C in the last 72 hours. CBG: No results for input(s): GLUCAP in the last 168 hours. Lipid Profile: No results for input(s): CHOL, HDL, LDLCALC, TRIG, CHOLHDL, LDLDIRECT in the last 72 hours. Thyroid Function Tests: No results for input(s): TSH, T4TOTAL, FREET4, T3FREE, THYROIDAB in the last 72 hours. Anemia Panel: Recent Labs    01/06/20 0637 01/07/20 0544  FERRITIN 128 129    Sepsis Labs: No results for input(s): PROCALCITON, LATICACIDVEN in the last 168 hours.  Recent Results (from the past 240 hour(s))  SARS Coronavirus 2 by RT PCR (hospital order, performed in [facility] hospital lab) Nasopharyngeal Nasopharyngeal Swab     Status: Abnormal   Collection Time: 01/05/20  8:33 AM   Specimen: Nasopharyngeal Swab  Result Value Ref Range Status   SARS Coronavirus 2 POSITIVE (A) NEGATIVE Final    Comment: RESULT CALLED TO, READ BACK BY AND VERIFIED WITH: CHRIS BENNETT AT 1004 ON 01/05/2020 [facility]. (NOTE) SARS-CoV-2 target nucleic acids are DETECTED  SARS-CoV-2 RNA is generally detectable in upper respiratory specimens  during the acute phase of infection.  Positive results are indicative  of the presence of the identified virus, but do not rule out bacterial infection or co-infection with other pathogens not detected by the test.  Clinical correlation with  patient history and  other diagnostic information is necessary to determine patient infection status.  The expected result is negative.  Fact Sheet for Patients:   BoilerBrush.com.cy   Fact Sheet for Healthcare Providers:   https://pope.com/    This test is not yet approved or cleared by the [place] FDA and  has been authorized for detection and/or diagnosis of SARS-CoV-2 by FDA under an Emergency Use Authorization (EUA).  This EUA will remain in effect (meaning thi s test can be used) for the duration of  the COVID-19 declaration under Section 564(b)(1) of the Act, 21 U.S.C. section 360-bbb-3(b)(1), unless the authorization is terminated or revoked sooner.  Performed at [facility], [address], [place], [place] [address]          Radiology Studies: No results found.      Scheduled Meds: . vitamin C  500 mg Oral Daily  . baricitinib  4 mg Oral Daily  . enoxaparin (LOVENOX) injection  55 mg Subcutaneous Q24H  .  fluticasone  2 spray Each Nare Daily  . guaiFENesin  1,200 mg Oral BID  . Ipratropium-Albuterol  1 puff Inhalation Q6H  . loratadine  10 mg Oral Daily  . methylPREDNISolone (SOLU-MEDROL) injection  60 mg Intravenous Q12H  . pantoprazole  40 mg Oral Q0600  . sodium chloride flush  3 mL Intravenous Q12H  . zinc sulfate  220 mg Oral Daily   Continuous Infusions: . sodium chloride Stopped (01/05/20 1017)  . remdesivir 100 mg in NS 100 mL 100 mg (01/07/20 1001)     LOS: 3 days    Time spent: 25 minutes    Tresa Moore, MD [facility] Pager [phone]  If 7PM-7AM, please contact night-coverage 01/07/2020, 2:02 PM

## 2020-01-08 LAB — COMPREHENSIVE METABOLIC PANEL
ALT: 24 U/L (ref 0–44)
AST: 18 U/L (ref 15–41)
Albumin: 2.7 g/dL — ABNORMAL LOW (ref 3.5–5.0)
Alkaline Phosphatase: 57 U/L (ref 38–126)
Anion gap: 10 (ref 5–15)
BUN: 20 mg/dL (ref 8–23)
CO2: 22 mmol/L (ref 22–32)
Calcium: 8.5 mg/dL — ABNORMAL LOW (ref 8.9–10.3)
Chloride: 98 mmol/L (ref 98–111)
Creatinine, Ser: 0.62 mg/dL (ref 0.44–1.00)
GFR calc Af Amer: >60 mL/min (ref >60)
GFR calc non Af Amer: >60 mL/min (ref >60)
Glucose, Bld: 164 mg/dL — ABNORMAL HIGH (ref 70–99)
Potassium: 4.6 mmol/L (ref 3.5–5.1)
Sodium: 130 mmol/L — ABNORMAL LOW (ref 135–145)
Total Bilirubin: 0.7 mg/dL (ref 0.3–1.2)
Total Protein: 6.8 g/dL (ref 6.5–8.1)

## 2020-01-08 LAB — CBC WITH DIFFERENTIAL/PLATELET
Abs Immature Granulocytes: 1.14 10*3/uL — ABNORMAL HIGH (ref 0.00–0.07)
Basophils Absolute: 0 10*3/uL (ref 0.0–0.1)
Basophils Relative: 0 %
Eosinophils Absolute: 0 10*3/uL (ref 0.0–0.5)
Eosinophils Relative: 0 %
HCT: 35.4 % — ABNORMAL LOW (ref 36.0–46.0)
Hemoglobin: 11.9 g/dL — ABNORMAL LOW (ref 12.0–15.0)
Immature Granulocytes: 10 %
Lymphocytes Relative: 11 %
Lymphs Abs: 1.4 10*3/uL (ref 0.7–4.0)
MCH: 26.8 pg (ref 26.0–34.0)
MCHC: 33.6 g/dL (ref 30.0–36.0)
MCV: 79.7 fL — ABNORMAL LOW (ref 80.0–100.0)
Monocytes Absolute: 0.9 10*3/uL (ref 0.1–1.0)
Monocytes Relative: 7 %
Neutro Abs: 8.7 10*3/uL — ABNORMAL HIGH (ref 1.7–7.7)
Neutrophils Relative %: 72 %
Platelets: 401 10*3/uL — ABNORMAL HIGH (ref 150–400)
RBC: 4.44 MIL/uL (ref 3.87–5.11)
RDW: 15.7 % — ABNORMAL HIGH (ref 11.5–15.5)
Smear Review: NORMAL
WBC: 12.1 10*3/uL — ABNORMAL HIGH (ref 4.0–10.5)
nRBC: 0 % (ref 0.0–0.2)

## 2020-01-08 LAB — FERRITIN: Ferritin: 84 ng/mL (ref 11–307)

## 2020-01-08 LAB — MAGNESIUM: Magnesium: 1.9 mg/dL (ref 1.7–2.4)

## 2020-01-08 LAB — C-REACTIVE PROTEIN: CRP: 0.9 mg/dL (ref <1.0)

## 2020-01-08 LAB — FIBRIN DERIVATIVES D-DIMER (ARMC ONLY): Fibrin derivatives D-dimer (ARMC): 434.29 ng/mL (FEU) (ref 0.00–499.00)

## 2020-01-08 LAB — PHOSPHORUS: Phosphorus: 3.1 mg/dL (ref 2.5–4.6)

## 2020-01-08 MED ORDER — BENZONATATE 100 MG PO CAPS: 100.0000 mg | ORAL_CAPSULE | Freq: Four times a day (QID) | ORAL | 0 refills | Status: AC | PRN

## 2020-01-08 MED ORDER — ASCORBIC ACID 500 MG PO TABS: 500.0000 mg | ORAL_TABLET | Freq: Every day | ORAL | 0 refills | Status: AC

## 2020-01-08 MED ORDER — PREDNISONE 50 MG PO TABS: 50.0000 mg | ORAL_TABLET | Freq: Every day | ORAL | 0 refills | Status: AC

## 2020-01-08 MED ORDER — ZINC SULFATE 220 (50 ZN) MG PO CAPS: 220.0000 mg | ORAL_CAPSULE | Freq: Every day | ORAL | 0 refills | Status: AC

## 2020-01-08 NOTE — Progress Notes (Signed)
SATURATION QUALIFICATIONS: (This note is used to comply with regulatory documentation for home oxygen)  Patient Saturations on Room Air at Rest = 95%  Patient Saturations on Room Air while Ambulating = 89%  Patient Saturations on 1 Liters of oxygen while Ambulating = 94% -  Please briefly explain why patient needs home oxygen:  Pt maintaining good O2 levels on RA at rest. Pt is able to quickly recover from 89% while ambulating back up to 95-96% on RA.

## 2020-01-08 NOTE — Discharge Instructions (Signed)
COVID-19 COVID-19 El COVID-19 es una infeccin respiratoria causada por un virus llamado coronavirus tipo 2 causante del sndrome respiratorio agudo grave (SARS-CoV-2). La enfermedad tambin se conoce como enfermedad por coronavirus o nuevo coronavirus. En algunas personas, el virus puede no ocasionar sntomas. En otras, puede producir una infeccin grave. La infeccin puede empeorar rpidamente y causar complicaciones, como:  Neumona o infeccin en los pulmones.  Sndrome de dificultad respiratoria aguda o SDRA. Es una afeccin que se caracteriza por la acumulacin de lquido en los pulmones, que impide que los pulmones se llenen de aire y pasen oxgeno a la sangre.  Insuficiencia respiratoria aguda. Es una afeccin que se caracteriza porque no pasa suficiente oxgeno de los pulmones al cuerpo o porque el dixido de carbono no pasa de los pulmones hacia afuera del cuerpo.  Sepsis o choque sptico. Se trata de una reaccin grave del cuerpo ante una infeccin.  Problemas de coagulacin.  Infecciones secundarias debido a bacterias u hongos.  Falla de rganos. Ocurre cuando los rganos del cuerpo dejan de funcionar. El virus que causa el COVID-19 es contagioso. Esto significa que puede transmitirse de una persona a otra a travs de las gotitas de saliva de la tos y de los estornudos (secreciones respiratorias). Cules son las causas? Esta enfermedad es causada por un virus. Usted puede contagiarse con este virus:  Al inspirar las gotitas de una persona infectada. Las gotitas pueden diseminarse cuando una persona respira, habla, canta, tose o estornuda.  Al tocar algo, como una mesa o el picaportes de una puerta, que estuvo expuesto al virus (contaminado) y luego tocarse la boca, nariz o los ojos. Qu incrementa el riesgo? Riesgo de infeccin Es ms probable que se infecte con este virus si:  Se encuentra a una distancia menor a 6 pies (2 metros) de una persona con COVID-19.  Cuida o  vive con una persona infectada con COVID-19.  Pasa tiempo en espacios interiores repletos de gente o vive en viviendas compartidas. Riesgo de enfermedad grave Es ms probable que se enferme gravemente por el virus si:  Tiene 50aos o ms. Cuanto mayor sea su edad, mayor ser el riesgo de tener una enfermedad grave.  Vive en un hogar de ancianos o centro de atencin a largo plazo.  Tiene cncer.  Tiene una enfermedad prolongada (crnica), como las siguientes: ? Enfermedad pulmonar crnica, que incluye la enfermedad pulmonar obstructiva crnica o asma. ? Una enfermedad crnica que disminuye la capacidad del cuerpo para combatir las infecciones (immunocomprometido). ? Enfermedad cardaca, que incluye insuficiencia cardaca, una afeccin que se caracteriza porque las arterias que llegan al corazn se estrechan u obstruyen (arteriopata coronaria) o una enfermedad que provoca que el msculo cardaco se engrose, se debilite o endurezca (miocardiopata). ? Diabetes. ? Enfermedad renal crnica. ? Anemia drepanoctica, una enfermedad que se caracteriza porque los glbulos rojos tienen una forma anormal de "hoz". ? Enfermedad heptica.  Es obeso. Cules son los signos o sntomas? Los sntomas de esta afeccin pueden ser de leves a graves. Los sntomas pueden aparecer en el trmino de 2 a 14 das despus de haber estado expuesto al virus. Incluyen los siguientes:  Fiebre o escalofros.  Tos.  Dificultad para respirar.  Dolores de cabeza, dolores en el cuerpo o dolores musculares.  Secrecin o congestin nasal.  Dolor de garganta.  Nueva prdida del sentido del gusto o del olfato. Algunas personas tambin pueden tener problemas estomacales, como nuseas, vmitos o diarrea. Es posible que otras personas no tengan sntomas de COVID-19.   Cmo se diagnostica? Esta afeccin se puede diagnosticar en funcin de lo siguiente:  Sus signos y sntomas, especialmente si: ? Vive en una zona  donde hay un brote de COVID-19. ? Viaj recientemente a una zona donde el virus es frecuente. ? Cuida o vive con una persona a quien se le diagnostic COVID-19. ? Estuvo expuesto a una persona a la que se le diagnostic COVID-19.  Un examen fsico.  Anlisis de laboratorio que pueden incluir: ? Tomar una muestra de lquido de la parte posterior de la nariz y la garganta (lquido nasofarngeo), la nariz o la garganta, con un hisopo. ? Una muestra de mucosidad de los pulmones (esputo). ? Anlisis de sangre.  Los estudios de diagnstico por imgenes pueden incluir radiografas, exploracin por tomografa computarizada (TC) o ecografa. Cmo se trata? En este momento, no hay ningn medicamento para tratar el COVID-19. Los medicamentos para tratar otras enfermedades se usan a modo de ensayo para comprobar si son eficaces contra el COVID-19. El mdico le informar sobre las maneras de tratar los sntomas. En la mayora de las personas, la infeccin es leve y puede controlarse en el hogar con reposo, lquidos y medicamentos de venta libre. El tratamiento para una infeccin grave suele realizarse en la unidad de cuidados intensivos (UCI) de un hospital. Puede incluir uno o ms de los siguientes. Estos tratamientos se administran hasta que los sntomas mejoran.  Recibir lquidos y medicamentos a travs de una va intravenosa.  Oxgeno complementario. Para administrar oxgeno extra, se utiliza un tubo en la nariz, una mascarilla o una campana de oxgeno.  Colocarlo para que se recueste boca abajo (decbito prono). Esto facilita el ingreso de oxgeno a los pulmones.  Uso continuo de una mquina de presin positiva de las vas areas (CPAP) o de presin positiva de las vas areas de dos niveles (BPAP). Este tratamiento utiliza una presin de aire leve para mantener las vas respiratorias abiertas. Un tubo conectado a un motor administra oxgeno al cuerpo.  Respirador. Este tratamiento mueve el aire  dentro y fuera de los pulmones mediante el uso de un tubo que se coloca en la trquea.  Traqueostoma. En este procedimiento se hace un orificio en el cuello para insertar un tubo de respiracin.  Oxigenacin por membrana extracorprea (OMEC). En este procedimiento, los pulmones tienen la posibilidad de recuperarse al asumir las funciones del corazn y los pulmones. Suministra oxgeno al cuerpo y elimina el dixido de carbono. Siga estas instrucciones en su casa: Estilo de vida  Si est enfermo, qudese en su casa, excepto para obtener atencin mdica. El mdico le indicar cunto tiempo debe quedarse en casa. Llame al mdico antes de buscar atencin mdica.  Haga reposo en su casa como se lo haya indicado el mdico.  No consuma ningn producto que contenga nicotina o tabaco, como cigarrillos, cigarrillos electrnicos y tabaco de mascar. Si necesita ayuda para dejar de fumar, consulte al mdico.  Retome sus actividades normales segn lo indicado por el mdico. Pregntele al mdico qu actividades son seguras para usted. Instrucciones generales  Use los medicamentos de venta libre y los recetados solamente como se lo haya indicado el mdico.  Beba suficiente lquido como para mantener la orina de color amarillo plido.  Concurra a todas las visitas de seguimiento como se lo haya indicado el mdico. Esto es importante. Cmo se evita?  No hay ninguna vacuna que ayude a prevenir la infeccin por COVID-19. Sin embargo, hay medidas que puede tomar para protegerse y proteger a   otras personas de este virus. Para protegerse:   No viaje a zonas donde el COVID-19 sea un riesgo. Las zonas donde se informa la presencia del COVID-19 cambian con frecuencia. Para identificar las zonas de alto riesgo y las restricciones de viaje, consulte el sitio web de viajes de los Centers for Disease Control and Prevention (CDC) (Centros para el Control y la Prevencin de Enfermedades):  wwwnc.cdc.gov/travel/notices  Si vive o debe viajar a una zona donde el COVID-19 es un riesgo, tome precauciones para evitar infecciones. ? Aljese de las personas enfermas. ? Lvese las manos frecuentemente con agua y jabn durante 20segundos. Use desinfectante para manos con alcohol si no dispone de agua y jabn. ? Evite tocarse la boca, la cara, los ojos o la nariz. ? Evite salir de su casa, siga las indicaciones de su estado y de las autoridades sanitarias locales. ? Si debe salir de su casa, use un barbijo de tela o una mascarilla facial. Asegrese de que le cubra la nariz y la boca. ? Evite los espacios interiores repletos de gente. Mantenga una distancia de al menos 6 pies (2 metros) de otras personas. ? Desinfecte los objetos y las superficies que se tocan con frecuencia todos los das. Pueden incluir:  Encimeras y mesas.  Picaportes e interruptores de luz.  Lavabos, fregaderos y grifos.  Aparatos electrnicos tales como telfonos, controles remotos, teclados, computadoras y tabletas. Cmo proteger a los dems: Si tiene sntomas de COVID-19, tome medidas para evitar que el virus se propague a otras personas.  Si cree que tiene una infeccin por COVID-19, comunquese de inmediato con su mdico. Informe al equipo de atencin mdica que cree que puede tener una infeccin por el COVID-19.  Qudese en su casa. Salga de su casa solo para buscar atencin mdica. No utilice el transporte pblico.  No viaje mientras est enfermo.  Lvese las manos frecuentemente con agua y jabn durante 20segundos. Usar desinfectante para manos con alcohol si no dispone de agua y jabn.  Mantngase alejado de quienes vivan con usted. Permita que los miembros de la familia sanos cuiden a los nios y las mascotas, si es posible. Si tiene que cuidar a los nios o las mascotas, lvese las manos con frecuencia y use un barbijo. Si es posible, permanezca en su habitacin, separado de los dems. Utilice un  bao diferente.  Asegrese de que todas las personas que viven en su casa se laven bien las manos y con frecuencia.  Tosa o estornude en un pauelo de papel o sobre su manga o codo. No tosa o estornude al aire ni se cubra la boca o la nariz con la mano.  Use un barbijo de tela o una mascarilla facial. Asegrese de que le cubra la nariz y la boca. Dnde buscar ms informacin  Centers for Disease Control and Prevention (Centros para el Control y la Prevencin de Enfermedades): www.cdc.gov/coronavirus/2019-ncov/index.html  World Health Organization (Organizacin Mundial de la Salud): www.who.int/health-topics/coronavirus Comunquese con un mdico si:  Vive o ha viajado a una zona donde el COVID-19 es un riesgo y tiene sntomas de infeccin.  Ha tenido contacto con alguien que tiene COVID-19 y usted tiene sntomas de infeccin. Solicite ayuda inmediatamente si:  Tiene dificultad para respirar.  Siente dolor u opresin en el pecho.  Experimenta confusin.  Tiene las uas de los dedos y los labios de color azulado.  Tiene dificultad para despertarse.  Los sntomas empeoran. Estos sntomas pueden representar un problema grave que constituye una emergencia. No espere   a ver si los sntomas desaparecen. Solicite atencin mdica de inmediato. Comunquese con el servicio de emergencias de su localidad (911 en los [place]). No conduzca por sus propios medios [employer] hospital. Informe al personal mdico de emergencias si cree que tiene COVID-19. Resumen  El COVID-19 es una infeccin respiratoria causada por un virus. Tambin se conoce como enfermedad por coronavirus o nuevo coronavirus. Puede causar infecciones graves, como neumona, sndrome de dificultad respiratoria aguda, insuficiencia respiratoria aguda o sepsis.  El virus que causa el COVID-19 es contagioso. Esto significa que puede transmitirse de [place] persona a otra a travs de las gotitas que se despiden al respirar, Heritage manager,  cantar, toser y Engineering geologist.  Es ms probable que desarrolle una enfermedad grave si tiene 50 aos o ms, tiene el sistema inmunitario dbil, vive en un hogar de ancianos o tiene una enfermedad crnica.  No hay ningn medicamento para tratar el COVID-19. El mdico le informar sobre las maneras de tratar los sntomas.  Tome medidas para protegerse y Conservator, museum/gallery a los Merchandiser, retail las infecciones. Lvese las manos con frecuencia y desinfecte los objetos y las superficies que se tocan con frecuencia todos [place]. Mantngase alejado de las personas que estn enfermas y use un barbijo si est enfermo. Esta informacin no tiene Theme park manager el consejo del mdico. Asegrese de hacerle al mdico cualquier pregunta que tenga. Document Revised: 02/20/2019 Document Reviewed: 06/15/2018 Elsevier Patient Education  2020 Elsevier Inc.  COVID-19: Cmo protegerse y proteger a los dems COVID-19: How to Protect Yourself and Others Sepa cmo se propaga  Actualmente, no existe ninguna vacuna para prevenir la enfermedad por coronavirus 2019 (COVID-19).  La mejor forma de prevenir la enfermedad es evitar exponerse a este virus.  Se cree que el virus se transmite principalmente de [place] persona a [place]. ? [employer] que estn en contacto directo entre s (a una distancia inferior a 6 pies [1.18m]). ? A travs de las gotitas respiratorias producidas cuando una persona infectada tose, estornuda o habla. ? Estas gotitas pueden caer en la boca o en la nariz de las personas que estn cerca o pueden ser [employer] pulmones. ? El COVID-19 puede ser transmitido por personas que no presentan sntomas. Lo que todos deben hacer [employer] las manos con frecuencia  Lvese las manos con frecuencia con agua y jabn durante al menos 20 segundos, especialmente despus de Oceanographer en un lugar pblico o despus de sonarse la nariz, toser o estornudar.  Si no dispone de [place] y [place], use un desinfectante  de manos que contenga al menos un 60% de alcohol. Cubra todas las superficies de las manos y frtelas hasta que se sientan secas.  No se toque los ojos, la nariz y la boca sin antes lavarse las manos. Evite el contacto cercano  Limite el contacto directo con otras personas tanto como sea posible.  Evite el contacto cercano con personas que estn enfermas.  Establezca distancia entre usted y [employer]. ? Recuerde que [employer] no tienen sntomas pueden transmitir el virus. ? Esto es Software engineer para las personas que tienen ms riesgo de [email or web address] Cbrase la boca y la nariz con una mascarilla cuando est cerca de otras personas  Puede transmitir el COVID-19 a otras personas aunque no se sienta enfermo.  Todas las Statistician en lugares pblicos y cuando estn con otras que no vivan en su casa, especialmente cuando el distanciamiento social sea difcil de [place]. ? Las  mascarillas no deben colocarse a nios menores de 2 aos de [address], a las personas que tienen problemas respiratorios o que estn inconscientes, incapacitadas o que por algn motivo no puedan quitarse la mascarilla sin [place].  El propsito de Nurse, adult es proteger a Economist en caso de que usted est infectado.  NO utilice las [employer] a los trabajadores de [employer].  Contine manteniendo una distancia aproximada de 6 pies (1.57m) entre usted y [employer]. La mascarilla no reemplaza el distanciamiento social. Cbrase al toser y estornudar  Al toser o al estornudar, siempre cbrase la boca y la nariz con un pauelo descartable o use el interior del codo.  Deseche los pauelos descartables usados en la basura.  Inmediatamente, lvese las manos con agua y [place] durante al menos 20segundos. Si no dispone de agua y [place], [employer] con un desinfectante de  manos que contenga al menos un 60% de alcohol. Limpie y desinfecte  Limpie Y desinfecte las superficies que se tocan con frecuencia todos los [address]. Esto incluye mesas, picaportes, interruptores de luz, encimeras, mangos, escritorios, telfonos, teclados, inodoros, grifos y lavabos. [email or web address]  Si las superficies estn sucias, lmpielas: Use detergente o jabn y agua antes de la desinfeccin.  Luego, use un desinfectante domstico. Puede consultar una lista de los desinfectantes domsticos registrados en Financial controller (EPA) (Agencia de Proteccin Ambiental) aqu. [email or web address] 01/01/2019 Esta informacin no tiene Theme park manager el consejo del mdico. Asegrese de hacerle al mdico cualquier pregunta que tenga. Document Revised: 01/15/2019 Document Reviewed: 11/08/2018 Elsevier Patient Education  2020 Elsevier Inc.   Preguntas frecuentes sobre el COVID-19 COVID-19 Frequently Asked Questions El COVID-19 (enfermedad por coronavirus) es una infeccin causada por una gran familia de virus. Algunos virus causan [place] y otros causan enfermedades en animales tales como los camellos, los gatos y los murcilagos. En algunos casos, los virus que causan [employer] pueden transmitirse a los seres humanos. De dnde provino el coronavirus? En diciembre de 2019, [place] le inform a Chief Technology Officer (Organizacin Mundial de la [place], Best boy) acerca de varios casos de enfermedad pulmonar (enfermedad respiratoria humana). Estos casos estaban vinculados con un mercado abierto de frutos de mar y [place] en la ciudad de [place]. El vnculo con el mercado de ganado y [employer] sugiere que el virus puede haberse propagado de los animales a los [place]. Sin embargo, desde Chiropodist brote en diciembre, tambin se ha demostrado que el virus se contagia de [place] persona a  Educational psychologist. Cul es el nombre de la enfermedad y del virus? Nombre de la enfermedad Al principio, esta enfermedad se llam nuevo coronavirus. Esto se debe a que los cientficos determinaron que la enfermedad era causada por un nuevo virus respiratorio. [employer] (Organizacin Mundial de la [place], [place]) ahora ha dado a la enfermedad el nombre de COVID-19, o enfermedad por coronavirus. Nombre del virus El virus causante de la enfermedad se conoce como coronavirus de tipo 2 causante del sndrome respiratorio agudo grave (SARS-CoV-2). Ms informacin sobre el nombre de la enfermedad y el virus Workd Health Organization (Organizacin Mundial de la Olivia Lopez de Gutierrez) (OMS): www.who.int/emergencies/diseases/novel-coronavirus-2019/technical-guidance/naming-the-coronavirus-disease-(covid-2019)-and-the-virus-that-causes-it Quines estn en riesgo de sufrir complicaciones debido a la enfermedad por coronavirus? Algunas personas pueden tener un riesgo ms alto de tener complicaciones debido a la enfermedad por coronavirus. Entre ellas se encuentran los [employer] y las personas que tienen enfermedades crnicas, como enfermedad cardaca, diabetes y enfermedad pulmonar. Si tiene un riesgo ms  alto de Advice worker, tome estas precauciones adicionales:  Public affairs consultant en su casa todo lo que sea posible.  [place] reuniones sociales y los viajes.  Evitar el contacto cercano con Producer, television/film/video. Permanecer a una distancia de al menos 6 pies (2 m) de las [employer], si es posible.  Lavarse las manos frecuentemente con agua y [place] durante al menos 20segundos.  Evitar tocarse la cara, la boca, la nariz y los ojos.  Tener a [employer] su casa, como alimentos, medicamentos y productos de limpieza.  Si debe salir de su casa, use un barbijo de tela o una mascarilla facial. Asegrese de que le cubra la nariz y la boca. Cmo se transmite la enfermedad causada por el coronavirus? El  virus que causa la enfermedad por coronavirus se transmite fcilmente de Ardelia Mems persona a otra (es contagioso). Usted puede contagiarse con este virus:  Al inspirar las gotitas de una persona infectada. Las [employer] pueden diseminarse cuando una persona respira, habla, canta, tose o estornuda.  Al tocar algo, como una mesa o el picaportes de [place], que estuvo expuesto al virus (contaminado) y luego tocarse la boca, nariz o los ojos. Puedo contraer al virus al tocar superficies u objetos? Todava hay mucho que no se conoce acerca del virus que causa la enfermedad por coronavirus. Los cientficos basan gran parte de la informacin en lo que saben sobre virus similares, por ejemplo:  En general, los virus no sobreviven en superficies durante mucho tiempo. Necesitan un cuerpo humano (husped) para sobrevivir.  Es ms probable que el virus se contagie por contacto cercano con personas que estn enfermas (contacto directo), por ejemplo: ? Al estrechar las manos o abrazarse. ? Al inhalar las gotitas respiratorias que se desplazan por el aire. Las [employer] pueden diseminarse cuando una persona respira, habla, canta, tose o estornuda.  Es menos probable que el virus se propague cuando una persona toca una superficie o un objeto sobre el que est el virus (contacto indirecto). El virus puede ingresar al cuerpo si la persona toca una superficie o un objeto y [employer] se toca la cara, los ojos, la nariz o la boca. Una persona puede contagiar el virus sin tener sntomas de la enfermedad? Puede ser posible que el virus se contagie antes de que la persona tenga sntomas de la enfermedad, pero muy probablemente esta no sea la principal forma en que el virus se est propagando. Es ms probable que el virus se propague al estar en contacto estrecho con personas que estn enfermas e inhalar las gotas respiratorias que una persona disemina al respirar, Electrical engineer, cantar, toser o estornudar. Cules son los sntomas de la  enfermedad causada por el coronavirus? Los sntomas varan de Ardelia Mems persona a otra y pueden variar de leves a graves. [employer], se pueden incluir los siguientes:  Systems analyst o escalofros.  Tos.  Dificultad para respirar o falta de aire.  Dolores de [place], dolores en el cuerpo o dolores musculares.  Secrecin o congestin nasal.  El dolor de garganta.  Nueva prdida del sentido del gusto o del olfato.  Nuseas, vmitos o diarrea. Estos sntomas pueden aparecer en el trmino de 2 a [address] despus de haber estado expuesto al virus. Algunas personas quizs no tengan sntomas. Si presenta sntomas, llame al mdico. Las personas con sntomas graves pueden necesitar atencin hospitalaria. Debo hacerme un anlisis de deteccin del virus? El mdico decidir si debe realizarse un anlisis en funcin de sus sntomas, antecedentes de exposicin  y factores de [place]. Cmo realiza el mdico el anlisis para detectar este virus? Los mdicos obtienen muestras para enviar a Chiropractor. Estas muestras pueden incluir lo siguiente:  Tomar con un hisopo Lauris Poag de lquido de la parte posterior de la nariz y la garganta, la nariz o la garganta.  Pedirle que tosa mucosidad (esputo) para extraer lquido de los pulmones en un recipiente estril.  Tomar una muestra de [place]. Hay algn tratamiento o vacuna para este virus? Actualmente, no existe ninguna vacuna para prevenir la enfermedad por coronavirus. Adems, no existen [employer] antibiticos o los antivirales para tratar el virus. Una persona que se enferma recibe tratamiento de apoyo, lo que significa reposo y lquidos. Una persona tambin puede aliviar sus sntomas con medicamentos de venta libre para tratar los estornudos, la tos y el goteo nasal. Son los mismos medicamentos que se toman para el resfro comn. Si presenta sntomas, llame al mdico. Las personas con sntomas graves pueden necesitar atencin hospitalaria. Qu  puedo hacer para protegerme y proteger a mi familia de este virus?     Puede protegerse y proteger a su familia tomando las mismas medidas que tomara para prevenir el contagio de otros virus. [employer] las siguientes medidas:  Lavarse las manos frecuentemente con agua y [place] durante al menos 20segundos. Usar desinfectante para manos con alcohol si no dispone de [place] y [place].  Evitar tocarse la cara, la boca, la nariz y los ojos.  Toser o estornudar en un pauelo descartable, sobre su manga o codo. No toser o estornudar al aire ni cubrirse con la [place]. ? Si tose o estornuda en un pauelo de papel, deschelo inmediatamente y [employer].  Desinfectar los [employer] y las superficies que se tocan con frecuencia todos [place].  Aljese de [employer].  Evite salir de su casa, siga las indicaciones de su estado y de las autoridades sanitarias locales.  Evite los espacios interiores repletos de gente. Permanezca a una distancia de al menos 6 pies (2 m) de las [employer].  Si debe salir de su casa, use un barbijo de tela o una mascarilla facial. Asegrese de que le cubra la nariz y la boca.  Lennie Hummer en su casa si est enfermo, excepto para obtener atencin mdica. Llame al mdico antes de buscar atencin mdica. El mdico le indicar cunto tiempo debe quedarse en casa.  Asegrese de [employer] las vacunas al da. Pregntele al mdico qu vacunas necesita. Qu debo hacer si tengo que viajar? Siga las recomendaciones relacionadas con los viajes de la autoridad de Psychiatrist, los CDC y Engineer, civil (consulting). Informacin y consejos para Nurse, adult for Disease Control and Prevention Insurance claims handler) (Centros para el Control y la Prevencin de Event organiser): [email or web address]  Organizacin Mundial de Radiographer, therapeutic (OMS): [email or web address].se [employer] riesgos y tome medidas para proteger su salud  El riesgo de  Primary school teacher la enfermedad por coronavirus es ms alto si viaja a zonas con un brote o si est en contacto con viajeros que provienen de zonas donde hay un brote.  Lvese las manos con frecuencia y [place] higiene [place] para reducir el riesgo de contagiarse o transmitir el virus. Qu debo hacer si estoy enfermo? Instrucciones generales para detener la propagacin de la infeccin  Lavarse las manos frecuentemente con agua y jabn durante al menos 20segundos. Usar desinfectante para manos con alcohol si no dispone de [place] y [place].  Toser o estornudar en un pauelo descartable, sobre su manga o codo. No  toser o estornudar al aire ni cubrirse con la [place].  Si tose o estornuda en un pauelo de papel, deschelo inmediatamente y [employer].  Lanny Hurst en su casa a menos que deba recibir [employer]. Llame al mdico o a la autoridad de salud local antes de buscar atencin mdica.  Evite las zonas pblicas. No viaje en transporte pblico, de ser posible.  Si puede, use un barbijo si debe salir de la casa o si est en contacto cercano con alguien que no est enfermo. Asegrese de que le cubra la nariz y la boca. Mantenga su casa limpia  Desinfecte los objetos y las superficies que se tocan con frecuencia todos [place]. Pueden incluir: ? Encimeras y [place]. ? Picaportes e interruptores de luz. ? Lavabos, fregaderos y grifos. ? Aparatos electrnicos tales como telfonos, controles remotos, teclados, computadoras y tabletas.  Lave los platos con agua jabonosa caliente o en el lavavajillas. Deje los platos para que se sequen al aire.  Lave la ropa con agua caliente. Evite infectar a otros miembros de la familia  Permita que los miembros de la familia sanos cuiden a los nios y las [place], si es posible. Si tiene que cuidar a los nios o las mascotas, lvese las manos con frecuencia y use un barbijo.  Duerma en una habitacin o cama diferentes, si es posible.  No comparta elementos  personales, como afeitadoras, cepillos de dientes, desodorantes, peines, cepillos, toallas y [address] de [address]. Dnde buscar ms informacin Centers for Disease Control and Prevention (CDC)  Actualizaciones de informacin y novedades: [email or web address].cz Organizacin Mundial de la Salud (OMS)  Actualizaciones de informacin y novedades: AffordableSalon.es  Tema de salud relacionado con el coronavirus: [email or web address]  Preguntas y Environmental health practitioner sobre COVID-19: [email or web address]  Registro mundial: who.sprinklr.com American Academy of Pediatrics (AAP) (Academia Estadounidense de Pediatra)  Informacin para familias: www.healthychildren.org/English/health-issues/conditions/chest-lungs/Pages/2019-Novel-Coronavirus.aspx La situacin del coronavirus cambia rpidamente. Consulte el sitio web de su autoridad de Psychiatrist o los sitios web de los CDC y la OMS para enterarse de las novedades y noticias. Cundo debo comunicarme con un mdico?  Comunquese con su mdico si tiene sntomas de infeccin, como fiebre o tos, y: ? Arlean Hopping cerca de alguien que sabe que tiene la enfermedad por coronavirus. ? Arlean Hopping en contacto con una persona que presuntamente sufra de la enfermedad por coronavirus. ? Ha viajado a una zona donde hay un brote de COVID-19. Cundo debo buscar asistencia mdica inmediata?  Busque ayuda de inmediato llamando al servicio de emergencias de su localidad (911 en los [place]) si tiene lo siguiente: ? Dificultad para respirar. ? Dolor u opresin en el pecho. ? Confusin. ? Labios y uas de [employer] [facility]. ? Dificultad para despertarse. ? Sntomas que empeoran. Informe al personal mdico de emergencias si cree que tiene la enfermedad por coronavirus. Resumen  Un nuevo virus respiratorio se propaga de Neomia Dear persona a otra y causa COVID-19 (enfermedad por  coronavirus).  El virus que causa el COVID-19 parece diseminarse fcilmente. Se transmite de [place] persona a otra a travs de las [employer] se despiden al respirar, Heritage manager, cantar, toser o estornudar.  Los [employer]OK y las personas que tienen enfermedades crnicas tienen mayor riesgo de Writer enfermedad. Si tiene un riesgo ms alto de tener complicaciones, tome Engineer, materials.  Actualmente, no existe ninguna vacuna para prevenir la enfermedad por coronavirus. No existen medicamentos, como los antibiticos o los antivirales, para tratar el virus.  Puede protegerse y proteger a su familia al lavarse  las manos con frecuencia, evitar tocarse la cara y cubrirse al toser y Engineering geologist. Esta informacin no tiene Theme park manager el consejo del mdico. Asegrese de hacerle al mdico cualquier pregunta que tenga. Document Revised: 02/20/2019 Document Reviewed: 08/26/2018 Elsevier Patient Education  2020 [employer].

## 2020-01-08 NOTE — Discharge Summary (Signed)
Physician Discharge Summary  Dorothy Patton GGY:[id number] DOB: [birth year] DOA: 01/04/2020  [facility]P: [facility]  Admit date: 01/04/2020 Discharge date: 01/08/2020  Admitted From: Home Disposition: Home  Recommendations for Outpatient Follow-up:  1. Follow up with PCP in 1-2 weeks   Home Health:No Equipment/Devices:None Discharge Condition: Stable CODE STATUS:Full Diet recommendation: Heart Healthy  Brief/Interim Summary: HPI per Dr. Imagene Sheller a 64 y.o.femalewith medical history significant forhypertension, arthritis and migraine headaches who presented to the ED for evaluation of shortness of breath. Patient reported that she started having symptoms of cough and malaise about a week and half ago but tested positive for the COVID-19 virus on 12/31/19.She complains of shortness of breath that has progressively worsened to the point that she is now short of breath at rest. Shortness of breath is associated with pleuritic chest pain and a non productive cough. She denies having fever, no nausea, no vomiting, no diarrhea or abdominal pain. Patient received 2 doses of the COVID 19 vaccine in April, 2021. Labsshow sodium 127, potassium, bicarb 22, glucose 139, BUN 10, creatinine 0.63,calcium 8.5, AST 35, ALT 23, WBC 8.7,hemoglobin 12.3, hematocrit 37,platelet count 234 Chest x-ray reviewed by me shows extensive patchy bilateral airspace disease compatible with pneumonia.  9/8: Patient seen and examined.  Titrated to room air however upon interview endorses worsening shortness of breath over interval.  Does better on 1 to 2 L nasal cannula.  Dyspnea improves.  9/9: Patient seen and examined on the day of discharge.  Stable, no distress.  Discharge instructions clearly explained to patient in Spanish.  All questions answered.  Patient expressed understanding.  Patient ambulated by nursing staff prior to discharge.  Mild desaturation to 89% upon ambulation.   Does not qualify for home oxygen.  Stable for discharge home on room air at this time.  Covid post discharge instructions provided in discharge packet.  Will prescribe remaining course of prednisone, albuterol MDI as needed, Tessalon Perles as needed.  Discharge Diagnoses:  Principal Problem:   Pneumonia due to COVID-19 virus Active Problems:   Hyponatremia   Hypokalemia  Pneumonia secondary to COVID-19 Mild hypoxia secondary to above Patient states that she received 2 doses of COVID-19 vaccine in April Chest x-ray consistent with bilateral infiltrates Appears to be improving over interval Respiratory status baseline at time of discharge Completed remdesivir in house We will continue steroids, prednisone 50 mg daily to complete total 10-day course  Hyponatremia Improved   Hypokalemia Stable at time of discharge  Discharge Instructions  Discharge Instructions    Diet - low sodium heart healthy   Complete by: As directed    Increase activity slowly   Complete by: As directed    MyChart COVID-19 home monitoring program   Complete by: Jan 08, 2020    Is the patient willing to use the MyChart Mobile App for home monitoring?: Yes   Temperature monitoring   Complete by: Jan 08, 2020    After how many days would you like to receive a notification of this patient's flowsheet entries?: 1     Allergies as of 01/08/2020   No Known Allergies     Medication List    TAKE these medications   albuterol 108 (90 Base) MCG/ACT inhaler Commonly known as: VENTOLIN HFA Inhale 2 puffs into the lungs every 6 (six) hours as needed for wheezing or shortness of breath.   amitriptyline 25 MG tablet Commonly known as: ELAVIL Take 25 mg by mouth at bedtime.  ascorbic acid 500 MG tablet Commonly known as: VITAMIN C Take 1 tablet (500 mg total) by mouth daily. Start taking on: January 09, 2020   benzonatate 100 MG capsule Commonly known as: Lawyer Take 1 capsule (100 mg  total) by mouth every 6 (six) hours as needed for cough.   buPROPion 300 MG 24 hr tablet Commonly known as: WELLBUTRIN XL Take 300 mg by mouth daily.   cyclobenzaprine 5 MG tablet Commonly known as: FLEXERIL Take 5 mg by mouth every 8 (eight) hours as needed.   fluticasone 50 MCG/ACT nasal spray Commonly known as: FLONASE Place 1 spray into both nostrils daily.   hydrochlorothiazide 25 MG tablet Commonly known as: HYDRODIURIL Take 25 mg by mouth daily.   naproxen 500 MG tablet Commonly known as: NAPROSYN Take 500 mg by mouth 2 (two) times daily as needed.   omeprazole 40 MG capsule Commonly known as: PRILOSEC Take by mouth.   Oyster Shell Calcium 500-400 MG-UNIT Tabs Take by mouth.   predniSONE 50 MG tablet Commonly known as: DELTASONE Take 1 tablet (50 mg total) by mouth daily for 6 days. Start taking on: January 09, 2020   quinapril 40 MG tablet Commonly known as: ACCUPRIL Take 40 mg by mouth daily.   sucralfate 1 g tablet Commonly known as: CARAFATE Take 1 g by mouth 4 (four) times daily as needed.   zinc sulfate 220 (50 Zn) MG capsule Take 1 capsule (220 mg total) by mouth daily. Start taking on: January 09, 2020       Follow-up Information    Abram Sander, MD. Go on 01/22/2020.   Specialty: Family Medicine Why: @9 :20 am Contact information: [address] [place] [phone]              No Known Allergies  Consultations:  None   Procedures/Studies: DG Chest 2 View  Result Date: 01/03/2020 CLINICAL DATA:  Shortness of breath EXAM: CHEST - 2 VIEW COMPARISON:  01/02/2016 FINDINGS: Patchy bilateral airspace disease. Low lung volumes. Heart is normal size. No effusions or acute bony abnormality. IMPRESSION: Extensive patchy bilateral airspace disease compatible with pneumonia. COVID pneumonia could have this appearance. Electronically Signed   By: 03/03/2016 M.D.   On: 01/03/2020 21:22    (Echo, Carotid, EGD,  Colonoscopy, ERCP)    Subjective: Seen and examined at time of discharge.  No distress.  Speaking complete sentences.  No respiratory distress.  No oxygen requirement.  Stable for discharge home.  Discharge Exam: Vitals:   01/08/20 0816 01/08/20 1129  BP: (!) 152/84 136/85  Pulse: 71 88  Resp: 18 18  Temp: 98.8 F (37.1 C) 98.4 F (36.9 C)  SpO2: 99% 92%   Vitals:   01/08/20 0353 01/08/20 0500 01/08/20 0816 01/08/20 1129  BP: (!) 141/78  (!) 152/84 136/85  Pulse: 72  71 88  Resp: 17  18 18   Temp: 98.6 F (37 C)  98.8 F (37.1 C) 98.4 F (36.9 C)  TempSrc: Oral  Oral Oral  SpO2: 94%  99% 92%  Weight:  105.5 kg    Height:        General: Pt is alert, awake, not in acute distress Cardiovascular: RRR, S1/S2 +, no rubs, no gallops Respiratory: CTA bilaterally, no wheezing, no rhonchi Abdominal: Soft, NT, ND, bowel sounds + Extremities: no edema, no cyanosis    The results of significant diagnostics from this hospitalization (including imaging, microbiology, ancillary and laboratory) are listed below for reference.  Microbiology: Recent Results (from the past 240 hour(s))  SARS Coronavirus 2 by RT PCR (hospital order, performed in [facility] hospital lab) Nasopharyngeal Nasopharyngeal Swab     Status: Abnormal   Collection Time: 01/05/20  8:33 AM   Specimen: Nasopharyngeal Swab  Result Value Ref Range Status   SARS Coronavirus 2 POSITIVE (A) NEGATIVE Final    Comment: RESULT CALLED TO, READ BACK BY AND VERIFIED WITH: CHRIS BENNETT AT 1004 ON 01/05/2020 [facility]. (NOTE) SARS-CoV-2 target nucleic acids are DETECTED  SARS-CoV-2 RNA is generally detectable in upper respiratory specimens  during the acute phase of infection.  Positive results are indicative  of the presence of the identified virus, but do not rule out bacterial infection or co-infection with other pathogens not detected by the test.  Clinical correlation with patient history and  other diagnostic  information is necessary to determine patient infection status.  The expected result is negative.  Fact Sheet for Patients:   BoilerBrush.com.cyhttps://www.fda.gov/media/136312/download   Fact Sheet for Healthcare Providers:   https://pope.com/https://www.fda.gov/media/136313/download    This test is not yet approved or cleared by the [place] FDA and  has been authorized for detection and/or diagnosis of SARS-CoV-2 by FDA under an Emergency Use Authorization (EUA).  This EUA will remain in effect (meaning thi s test can be used) for the duration of  the COVID-19 declaration under Section 564(b)(1) of the Act, 21 U.S.C. section 360-bbb-3(b)(1), unless the authorization is terminated or revoked sooner.  Performed at [facility], [address] [place], [place] [id number]      Labs: BNP (last 3 results) No results for input(s): BNP in the last 8760 hours. Basic Metabolic Panel: Recent Labs  Lab 01/03/20 2043 01/05/20 0742 01/06/20 0637 01/07/20 0544 01/08/20 0622  NA 127* 133* 131* 131* 130*  K 3.4* 4.2 4.5 4.9 4.6  CL 93* 100 99 99 98  CO2 22 23 21* 22 22  GLUCOSE 139* 195* 167* 184* 164*  BUN 10 22 20 19 20   CREATININE 0.63 0.58 0.70 0.71 0.62  CALCIUM 8.5* 8.6* 8.7* 8.9 8.5*  MG  --  2.1 2.1 2.0 1.9  PHOS  --  2.7 2.8 3.1 3.1   Liver Function Tests: Recent Labs  Lab 01/03/20 2043 01/05/20 0742 01/06/20 0637 01/07/20 0544 01/08/20 0622  AST 35 40 30 27 18   ALT 23 26 24 27 24   ALKPHOS 63 59 54 59 57  BILITOT 0.6 0.5 0.5 0.6 0.7  PROT 7.5 7.4 7.3 7.2 6.8  ALBUMIN 3.1* 2.8* 2.9* 2.9* 2.7*   No results for input(s): LIPASE, AMYLASE in the last 168 hours. No results for input(s): AMMONIA in the last 168 hours. CBC: Recent Labs  Lab 01/03/20 2043 01/05/20 0742 01/06/20 0637 01/07/20 0544 01/08/20 0622  WBC 8.7 6.4 11.0* 12.1* 12.1*  NEUTROABS 7.8* 5.3 8.5* 8.9* 8.7*  HGB 12.3 12.3 12.3 12.6 11.9*  HCT 37.0 35.2* 36.2 37.2 35.4*  MCV 79.7* 78.0* 78.4* 79.7* 79.7*  PLT  234 294 366 383 401*   Cardiac Enzymes: No results for input(s): CKTOTAL, CKMB, CKMBINDEX, TROPONINI in the last 168 hours. BNP: Invalid input(s): POCBNP CBG: No results for input(s): GLUCAP in the last 168 hours. D-Dimer No results for input(s): DDIMER in the last 72 hours. Hgb A1c No results for input(s): HGBA1C in the last 72 hours. Lipid Profile No results for input(s): CHOL, HDL, LDLCALC, TRIG, CHOLHDL, LDLDIRECT in the last 72 hours. Thyroid function studies No results for input(s): TSH, T4TOTAL, T3FREE,  THYROIDAB in the last 72 hours.  Invalid input(s): FREET3 Anemia work up [employer]    01/07/20 0544 01/08/20 0622  FERRITIN 129 84   Urinalysis No results found for: COLORURINE, APPEARANCEUR, LABSPEC, PHURINE, GLUCOSEU, HGBUR, BILIRUBINUR, KETONESUR, PROTEINUR, UROBILINOGEN, NITRITE, LEUKOCYTESUR Sepsis Labs Invalid input(s): PROCALCITONIN,  WBC,  LACTICIDVEN Microbiology Recent Results (from the past 240 hour(s))  SARS Coronavirus 2 by RT PCR (hospital order, performed in [facility] hospital lab) Nasopharyngeal Nasopharyngeal Swab     Status: Abnormal   Collection Time: 01/05/20  8:33 AM   Specimen: Nasopharyngeal Swab  Result Value Ref Range Status   SARS Coronavirus 2 POSITIVE (A) NEGATIVE Final    Comment: RESULT CALLED TO, READ BACK BY AND VERIFIED WITH: CHRIS BENNETT AT 1004 ON 01/05/2020 [facility]. (NOTE) SARS-CoV-2 target nucleic acids are DETECTED  SARS-CoV-2 RNA is generally detectable in upper respiratory specimens  during the acute phase of infection.  Positive results are indicative  of the presence of the identified virus, but do not rule out bacterial infection or co-infection with other pathogens not detected by the test.  Clinical correlation with patient history and  other diagnostic information is necessary to determine patient infection status.  The expected result is negative.  Fact Sheet for Patients:    BoilerBrush.com.cy   Fact Sheet for Healthcare Providers:   https://pope.com/    This test is not yet approved or cleared by the [place] FDA and  has been authorized for detection and/or diagnosis of SARS-CoV-2 by FDA under an Emergency Use Authorization (EUA).  This EUA will remain in effect (meaning thi s test can be used) for the duration of  the COVID-19 declaration under Section 564(b)(1) of the Act, 21 U.S.C. section 360-bbb-3(b)(1), unless the authorization is terminated or revoked sooner.  Performed at [facility], [address] [place], [place] [address]      Time coordinating discharge: Over 30 minutes  SIGNED:   Tresa Moore, MD  [facility] 01/08/2020, 4:12 PM Pager   If 7PM-7AM, please contact night-coverage

## 2020-05-14 ENCOUNTER — Other Ambulatory Visit: Payer: Self-pay | Admitting: Family Medicine

## 2020-05-14 DIAGNOSIS — Z1231 Encounter for screening mammogram for malignant neoplasm of breast: Secondary | ICD-10-CM

## 2020-06-02 ENCOUNTER — Other Ambulatory Visit: Payer: Self-pay

## 2020-06-02 ENCOUNTER — Ambulatory Visit
Admission: RE | Admit: 2020-06-02 | Discharge: 2020-06-02 | Disposition: A | Discharge status: Home / Self Care | Payer: Medicare Other | Source: Ambulatory Visit | Attending: Family Medicine | Admitting: Family Medicine

## 2020-06-02 DIAGNOSIS — Z1231 Encounter for screening mammogram for malignant neoplasm of breast: Secondary | ICD-10-CM | POA: Diagnosis not present

## 2021-02-25 ENCOUNTER — Other Ambulatory Visit: Payer: Self-pay | Admitting: Family Medicine

## 2021-02-25 ENCOUNTER — Other Ambulatory Visit: Payer: Self-pay

## 2021-02-25 ENCOUNTER — Ambulatory Visit
Admission: RE | Admit: 2021-02-25 | Discharge: 2021-02-25 | Disposition: A | Discharge status: Home / Self Care | Payer: Medicare Other | Source: Ambulatory Visit | Attending: Family Medicine | Admitting: Family Medicine

## 2021-02-25 DIAGNOSIS — M79662 Pain in left lower leg: Secondary | ICD-10-CM

## 2021-05-04 ENCOUNTER — Other Ambulatory Visit: Payer: Self-pay | Admitting: Family Medicine

## 2021-05-04 DIAGNOSIS — Z1231 Encounter for screening mammogram for malignant neoplasm of breast: Secondary | ICD-10-CM

## 2021-06-09 ENCOUNTER — Other Ambulatory Visit: Payer: Self-pay

## 2021-06-09 ENCOUNTER — Ambulatory Visit
Admission: RE | Admit: 2021-06-09 | Discharge: 2021-06-09 | Disposition: A | Discharge status: Home / Self Care | Payer: Medicare Other | Source: Ambulatory Visit | Attending: Family Medicine | Admitting: Family Medicine

## 2021-06-09 DIAGNOSIS — Z1231 Encounter for screening mammogram for malignant neoplasm of breast: Secondary | ICD-10-CM | POA: Diagnosis not present

## 2021-09-04 ENCOUNTER — Emergency Department: Payer: Medicare Other

## 2021-09-04 ENCOUNTER — Emergency Department
Admission: EM | Admit: 2021-09-04 | Discharge: 2021-09-04 | Disposition: A | Discharge status: Home / Self Care | Payer: Medicare Other | Attending: Student in an Organized Health Care Education/Training Program | Admitting: Student in an Organized Health Care Education/Training Program

## 2021-09-04 DIAGNOSIS — W01198A Fall on same level from slipping, tripping and stumbling with subsequent striking against other object, initial encounter: Secondary | ICD-10-CM | POA: Insufficient documentation

## 2021-09-04 DIAGNOSIS — S299XXA Unspecified injury of thorax, initial encounter: Secondary | ICD-10-CM | POA: Diagnosis present

## 2021-09-04 DIAGNOSIS — R911 Solitary pulmonary nodule: Secondary | ICD-10-CM | POA: Insufficient documentation

## 2021-09-04 DIAGNOSIS — I10 Essential (primary) hypertension: Secondary | ICD-10-CM | POA: Diagnosis not present

## 2021-09-04 DIAGNOSIS — S20212A Contusion of left front wall of thorax, initial encounter: Secondary | ICD-10-CM | POA: Insufficient documentation

## 2021-09-04 MED ORDER — LIDOCAINE 5 % EX PTCH
1.0000 | MEDICATED_PATCH | CUTANEOUS | Status: DC
  Administered 2021-09-04: 1 via TRANSDERMAL
  Filled 2021-09-04: qty 1

## 2021-09-04 MED ORDER — LIDOCAINE 5 % EX PTCH: 1.0000 | MEDICATED_PATCH | Freq: Two times a day (BID) | CUTANEOUS | 0 refills | Status: AC

## 2021-09-04 NOTE — ED Provider Notes (Signed)
? ?[facility] ?Provider Note ? ? ? Event Date/Time  ? First MD Initiated Contact with Patient 09/04/21 1148   ?  (approximate) ? ? ?History  ? ?Rib Injury ? ? ?HPI ?Spanish interpreter via [employer] was used. ?Dorothy Patton is a 66 y.o. female   presents to the ED with complaint of left upper chest and rib pain from a mechanical fall that happened 3 days ago.  Patient states that she landed on the corner of a table.  Patient denies any head injury or loss of consciousness.  Patient states that pain is increased with deep inspiration.  Currently she does take tramadol 3 times a day for arthritis and she is continue to do so since this accident.  Patient has a history of hypertension, arthritis, vitamin D deficiency and migraines. ? ?  ? ? ?Physical Exam  ? ?Triage Vital Signs: ?ED Triage Vitals  ?Enc Vitals Group  ?   BP 09/04/21 1105 (!) 143/91  ?   Pulse Rate 09/04/21 1105 92  ?   Resp 09/04/21 1105 (!) 22  ?   Temp 09/04/21 1105 98.6 ?F (37 ?C)  ?   Temp Source 09/04/21 1105 Oral  ?   SpO2 09/04/21 1105 100 %  ?   Weight 09/04/21 1103 224 lb (101.6 kg)  ?   Height --   ?   Head Circumference --   ?   Peak Flow --   ?   Pain Score 09/04/21 1103 8  ?   Pain Loc --   ?   Pain Edu? --   ?   Excl. in [place]? --   ? ? ?Most recent vital signs: ?Vitals:  ? 09/04/21 1200 09/04/21 1230  ?BP:    ?Pulse: 87 81  ?Resp:    ?Temp:    ?SpO2: 100% 100%  ? ? ? ?General: Awake, no distress.  ?CV:  Good peripheral perfusion.  ?Resp:  Normal effort.  Lungs are clear bilaterally.  There is an ecchymotic area at the upper left chest wall area that is tender to palpation.  No crepitus present.  Skin is intact. ?Abd:  No distention.  ?Other:   ? ? ?ED Results / Procedures / Treatments  ? ?Labs ?(all labs ordered are listed, but only abnormal results are displayed) ?Labs Reviewed - No data to display ? ? ?EKG ? ?Normal sinus rhythm ?Vent. rate 96 BPM ?PR interval 142 ms ?QRS duration 78 ms ?QT/QTcB 338/427 ms ?P-R-T  axes 51 25 51 ? ?RADIOLOGY ?Left rib x-rays were negative for obvious rib fracture.  Radiology report is negative for rib fracture but does mention a 5 mm nodule in the right middle lobe. ? ? ? ?PROCEDURES: ? ?Critical Care performed:  ? ?Procedures ? ? ?MEDICATIONS ORDERED IN ED: ?Medications  ?lidocaine (LIDODERM) 5 % 1 patch (has no administration in time range)  ? ? ? ?IMPRESSION / MDM / ASSESSMENT AND PLAN / ED COURSE  ?I reviewed the triage vital signs and the nursing notes. ? ? ?Differential diagnosis includes, but is not limited to, contusion chest wall, fractured left ribs. ? ?66 year old female presents to the ED with complaint of left upper chest wall pain for 3 days after she had a mechanical fall and hit the edge of a table.  Patient has continued to take her tramadol that she normally takes for her arthritis 3 times a day.  She states that there is increased pain with inspiration and is concerned  for rib fracture.  X-rays were reassuring and patient was told that she does not have a rib fracture.  With the [employer] Spanish interpreter we discussed at length the small nodule that was noted to her right middle lung and the need to have this evaluated.  Daughter is present and understands that the order will be put in and that they will most likely get a phone call from the cancer center to have this area evaluated further.  Lidoderm patch was placed on the area most tender and patient was told to continue taking her tramadol as prescribed by her doctor.  Also patient is a non-smoker and routinely sees her PCP at [facility]. ? ? ?  ? ? ?FINAL CLINICAL IMPRESSION(S) / ED DIAGNOSES  ? ?Final diagnoses:  ?Contusion, chest wall, left, initial encounter  ?Nodule of middle lobe of right lung  ? ? ? ?Rx / DC Orders  ? ?ED Discharge Orders   ? ?      Ordered  ?  AMB  Referral to Pulmonary Nodule Clinic       ? 09/04/21 1216  ?  lidocaine (LIDODERM) 5 %  Every 12 hours       ? 09/04/21 1225  ? ?  ?  ? ?   ? ? ? ?Note:  This document was prepared using Dragon voice recognition software and may include unintentional dictation errors. ?  ?Johnn Hai, PA-C ?09/04/21 1231 ? ?  ?Merlyn Lot, MD ?09/04/21 1235 ? ?

## 2021-09-04 NOTE — Discharge Instructions (Addendum)
Follow-up with your primary care provider if any continued problems with your chest.  Use the Lidoderm patches as needed for pain.  Continue taking your tramadol as you have been taking in the past.  Also remember that the cancer center will be calling you for follow-up evaluation of the small nodule that you have on your right lung. ?

## 2021-09-04 NOTE — ED Notes (Signed)
Pt to ED c/o pain to upper L chest that goes through to shoulder blade area. States pain is worse lying supine and it hurts to breathe deeply. Pt had a fall 3 days ago, has small area of bruising to anterior upper L chest from where she hit with fall. Does not remember if she tripped or how the fall occurred but did not lose consciousness or hit head. ?

## 2021-09-04 NOTE — ED Notes (Signed)
E-signature pad not functional. 

## 2021-09-04 NOTE — ED Triage Notes (Signed)
Pt had mechanical fall Thursday afternoonand landed on her upper chest/ribs on the corner of a table. Has a bruise. States pain in that area and pain with inspiration since then.  ?

## 2022-07-24 ENCOUNTER — Other Ambulatory Visit: Payer: Self-pay | Admitting: Family Medicine

## 2022-07-24 DIAGNOSIS — Z1231 Encounter for screening mammogram for malignant neoplasm of breast: Secondary | ICD-10-CM

## 2022-08-23 ENCOUNTER — Ambulatory Visit
Admission: RE | Admit: 2022-08-23 | Discharge: 2022-08-23 | Disposition: A | Discharge status: Home / Self Care | Payer: Medicare Other | Source: Ambulatory Visit | Attending: Family Medicine | Admitting: Family Medicine

## 2022-08-23 DIAGNOSIS — Z1231 Encounter for screening mammogram for malignant neoplasm of breast: Secondary | ICD-10-CM | POA: Insufficient documentation

## 2022-08-25 ENCOUNTER — Encounter: Payer: Self-pay | Admitting: Family Medicine

## 2022-08-30 ENCOUNTER — Other Ambulatory Visit: Payer: Self-pay | Admitting: Family Medicine

## 2022-08-30 DIAGNOSIS — R921 Mammographic calcification found on diagnostic imaging of breast: Secondary | ICD-10-CM

## 2022-08-30 DIAGNOSIS — R928 Other abnormal and inconclusive findings on diagnostic imaging of breast: Secondary | ICD-10-CM

## 2022-09-04 ENCOUNTER — Ambulatory Visit
Admission: RE | Admit: 2022-09-04 | Discharge: 2022-09-04 | Disposition: A | Discharge status: Home / Self Care | Payer: Medicare HMO | Source: Ambulatory Visit | Attending: Family Medicine | Admitting: Family Medicine

## 2022-09-04 DIAGNOSIS — R928 Other abnormal and inconclusive findings on diagnostic imaging of breast: Secondary | ICD-10-CM | POA: Diagnosis not present

## 2022-09-04 DIAGNOSIS — R921 Mammographic calcification found on diagnostic imaging of breast: Secondary | ICD-10-CM | POA: Diagnosis present

## 2023-03-07 ENCOUNTER — Other Ambulatory Visit: Payer: Self-pay | Admitting: Family Medicine

## 2023-03-07 DIAGNOSIS — R921 Mammographic calcification found on diagnostic imaging of breast: Secondary | ICD-10-CM

## 2023-03-27 ENCOUNTER — Ambulatory Visit
Admission: RE | Admit: 2023-03-27 | Discharge: 2023-03-27 | Disposition: A | Discharge status: Home / Self Care | Payer: Medicare HMO | Source: Ambulatory Visit | Attending: Family Medicine | Admitting: Family Medicine

## 2023-03-27 DIAGNOSIS — R921 Mammographic calcification found on diagnostic imaging of breast: Secondary | ICD-10-CM | POA: Insufficient documentation

## 2023-06-24 IMAGING — US US EXTREM LOW VENOUS*L*
1 series · 13 of 24 positions shown · non-contrast
Comparison: None.

CLINICAL DATA: Left calf pain and swelling for 2 weeks



[Series 1: us venous img lower uni left (dvt) · portal-venous · 13 of 32 slices shown]
[im 1/32]
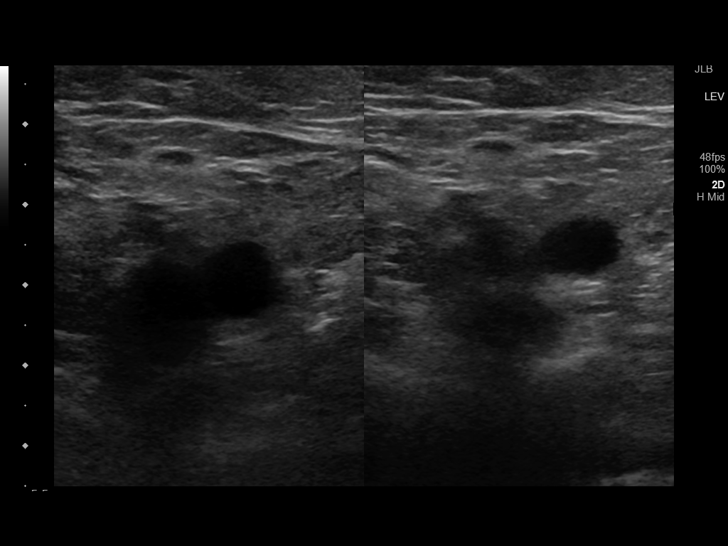
[im 3/32]
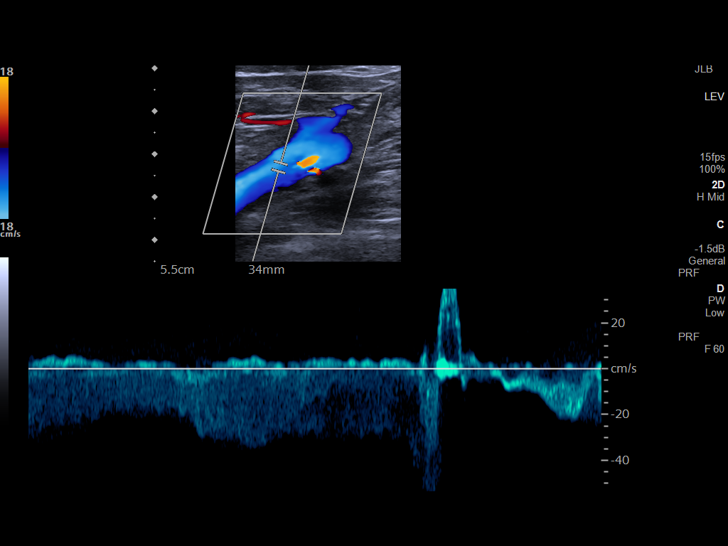
[im 6/32]
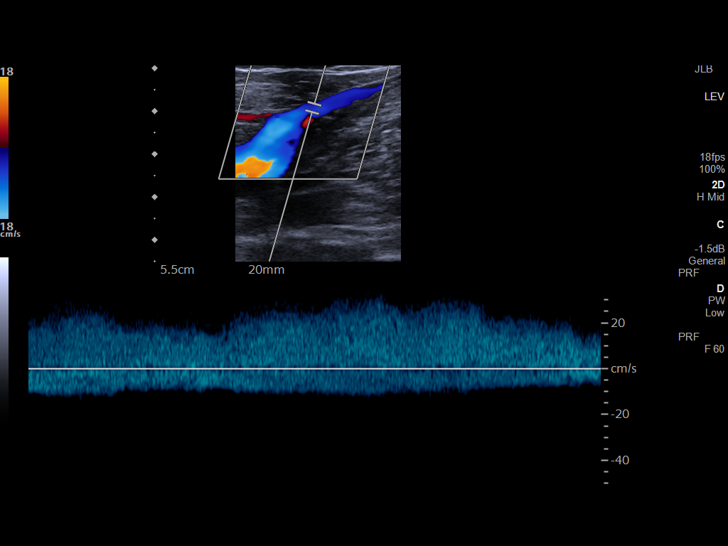
[im 9/32]
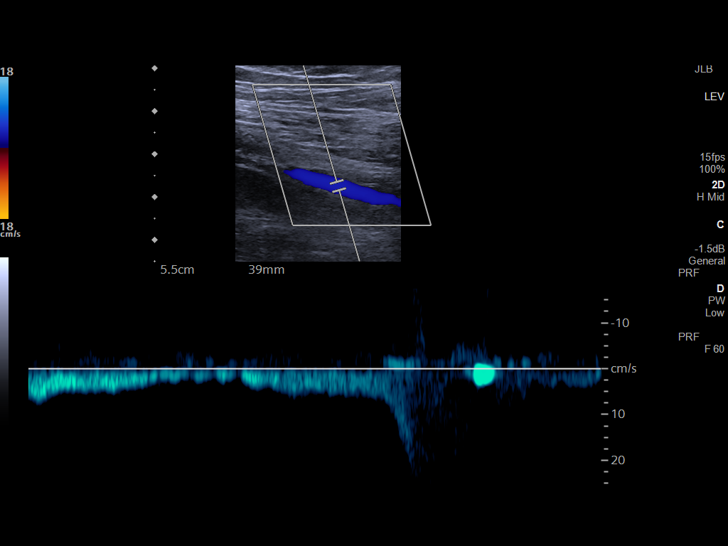
[im 11/32]
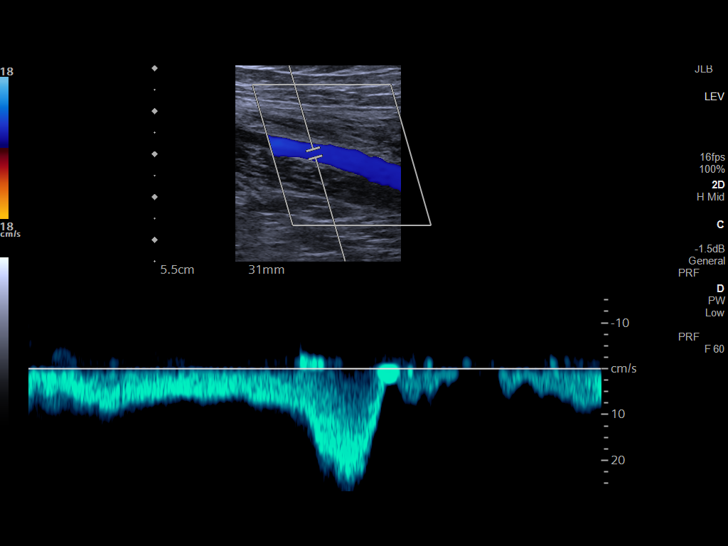
[im 14/32]
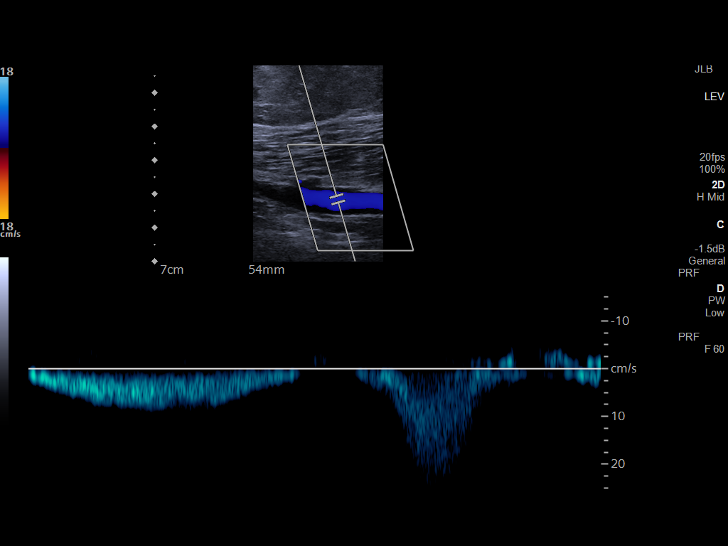
[im 17/32]
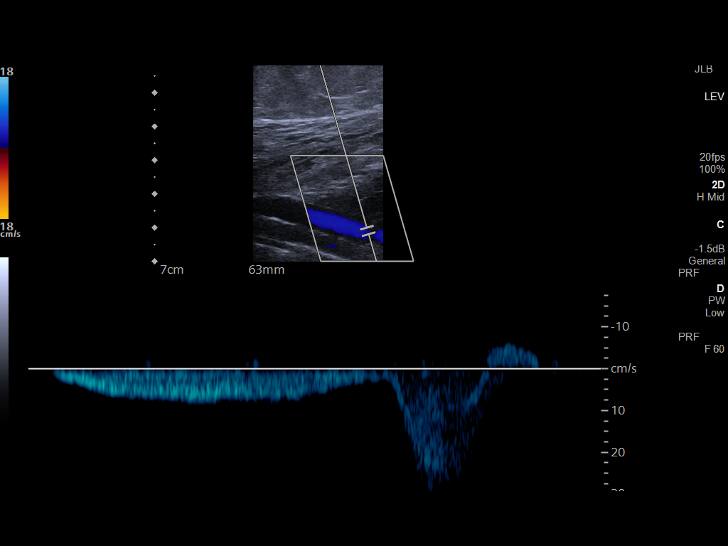
[im 18/32]
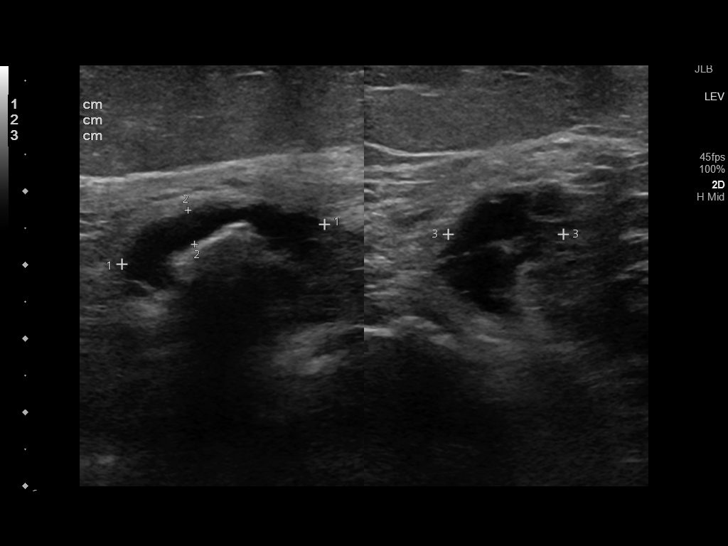
[im 21/32]
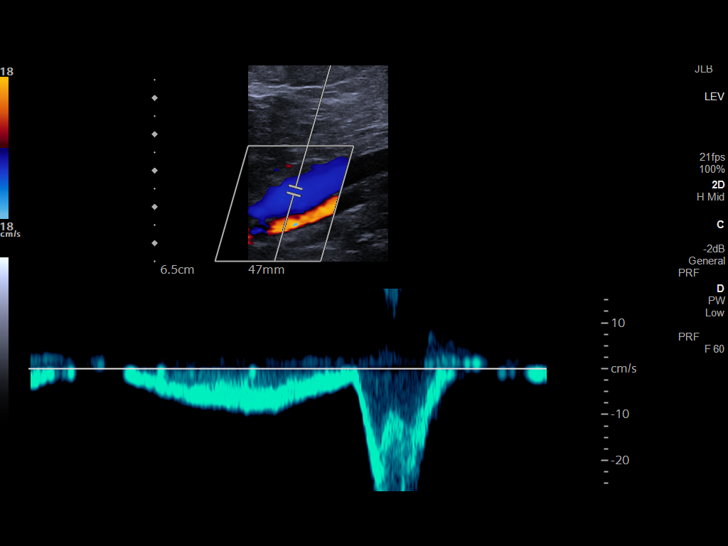
[im 23/32]
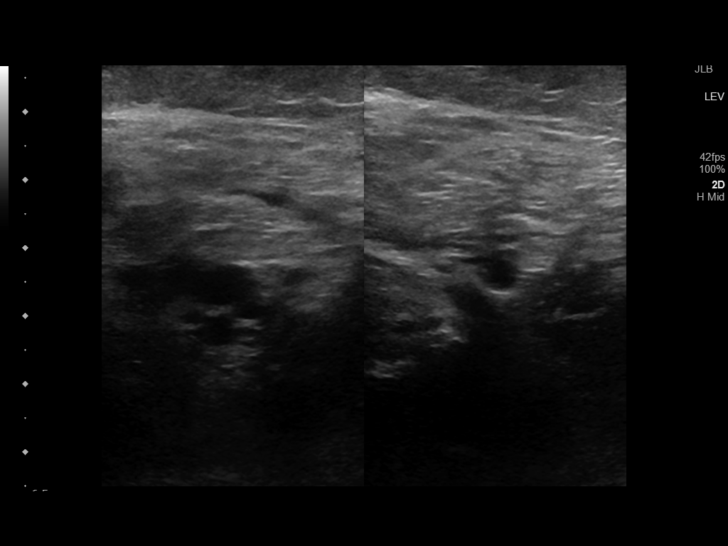
[im 26/32]
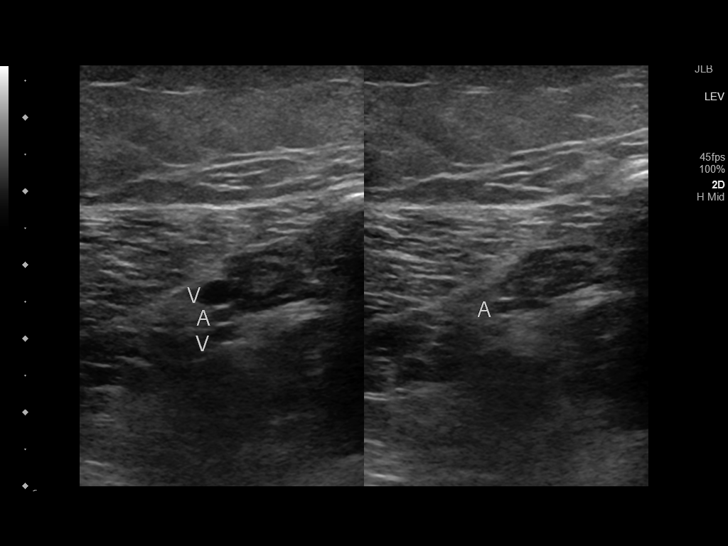
[im 29/32]
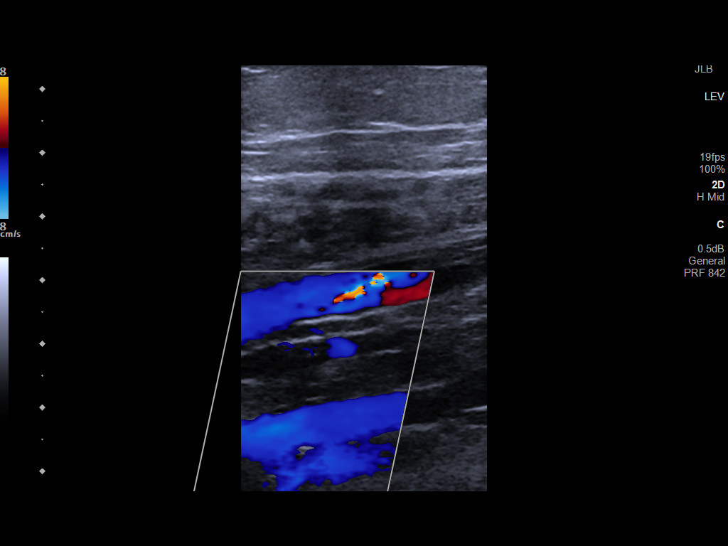
[im 32/32]
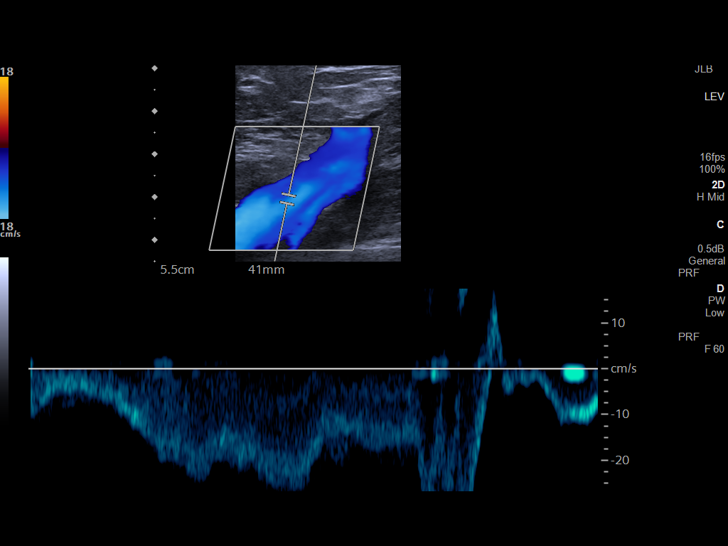

[13 of 24 positions shown; findings below may reference images not displayed]

FINDINGS: Contralateral Common Femoral Vein: Respiratory phasicity is normal
and symmetric with the symptomatic side. No evidence of thrombus.
Normal compressibility.

Common Femoral Vein: No evidence of thrombus. Normal
compressibility, respiratory phasicity and response to augmentation.

Saphenofemoral Junction: No evidence of thrombus. Normal
compressibility and flow on color Doppler imaging.

Profunda Femoral Vein: No evidence of thrombus. Normal
compressibility and flow on color Doppler imaging.

Femoral Vein: No evidence of thrombus. Normal compressibility,
respiratory phasicity and response to augmentation.

Popliteal Vein: No evidence of thrombus. Normal compressibility,
respiratory phasicity and response to augmentation.

Calf Veins: No evidence of thrombus. Normal compressibility and flow
on color Doppler imaging.

Other Findings: Left popliteal fossa complex septated small fluid
collection measures 2.8 x 0.5 x 1.6 cm favored to be a Baker's cyst.
IMPRESSION: No evidence of deep venous thrombosis.

Small popliteal fossa Baker's cyst, 2.8 cm

## 2023-09-12 ENCOUNTER — Other Ambulatory Visit: Payer: Self-pay | Admitting: Family Medicine

## 2023-09-12 DIAGNOSIS — R928 Other abnormal and inconclusive findings on diagnostic imaging of breast: Secondary | ICD-10-CM

## 2023-09-12 DIAGNOSIS — R921 Mammographic calcification found on diagnostic imaging of breast: Secondary | ICD-10-CM

## 2023-12-17 ENCOUNTER — Ambulatory Visit
Admission: RE | Admit: 2023-12-17 | Discharge: 2023-12-17 | Disposition: A | Discharge status: Home / Self Care | Source: Ambulatory Visit | Attending: Family Medicine | Admitting: Family Medicine

## 2023-12-17 DIAGNOSIS — R921 Mammographic calcification found on diagnostic imaging of breast: Secondary | ICD-10-CM | POA: Diagnosis present

## 2023-12-17 DIAGNOSIS — R928 Other abnormal and inconclusive findings on diagnostic imaging of breast: Secondary | ICD-10-CM | POA: Diagnosis present
